# Patient Record
Sex: Male | Born: 1945
Health system: Southern US, Community
[De-identification: ages and names within clinical notes are randomized; demographics above are authoritative.]

## PROBLEM LIST (undated history)

## (undated) DIAGNOSIS — I2510 Atherosclerotic heart disease of native coronary artery without angina pectoris: Secondary | ICD-10-CM

## (undated) DIAGNOSIS — M1990 Unspecified osteoarthritis, unspecified site: Secondary | ICD-10-CM

## (undated) DIAGNOSIS — I10 Essential (primary) hypertension: Secondary | ICD-10-CM

## (undated) DIAGNOSIS — N189 Chronic kidney disease, unspecified: Secondary | ICD-10-CM

## (undated) DIAGNOSIS — E785 Hyperlipidemia, unspecified: Secondary | ICD-10-CM

## (undated) DIAGNOSIS — L237 Allergic contact dermatitis due to plants, except food: Secondary | ICD-10-CM

## (undated) DIAGNOSIS — Z86010 Personal history of colonic polyps: Secondary | ICD-10-CM

## (undated) DIAGNOSIS — Z87442 Personal history of urinary calculi: Secondary | ICD-10-CM

## (undated) DIAGNOSIS — N200 Calculus of kidney: Secondary | ICD-10-CM

## (undated) HISTORY — DX: Chronic kidney disease, unspecified: N18.9

## (undated) HISTORY — PX: CHOLECYSTECTOMY: SHX55

## (undated) HISTORY — DX: Allergic contact dermatitis due to plants, except food: L23.7

## (undated) HISTORY — DX: Personal history of colonic polyps: Z86.010

## (undated) HISTORY — DX: Hyperlipidemia, unspecified: E78.5

## (undated) HISTORY — PX: TONSILLECTOMY: SUR1361

## (undated) HISTORY — PX: COLONOSCOPY: SHX174

## (undated) HISTORY — DX: Calculus of kidney: N20.0

---

## 2002-05-02 ENCOUNTER — Encounter (INDEPENDENT_AMBULATORY_CARE_PROVIDER_SITE_OTHER): Payer: Self-pay | Admitting: Specialist

## 2002-05-02 ENCOUNTER — Ambulatory Visit (HOSPITAL_COMMUNITY): Admission: RE | Admit: 2002-05-02 | Discharge: 2002-05-02 | Payer: Self-pay | Admitting: General Surgery

## 2003-07-04 ENCOUNTER — Ambulatory Visit (HOSPITAL_COMMUNITY): Admission: RE | Admit: 2003-07-04 | Discharge: 2003-07-04 | Payer: Self-pay | Admitting: *Deleted

## 2003-07-04 ENCOUNTER — Encounter (INDEPENDENT_AMBULATORY_CARE_PROVIDER_SITE_OTHER): Payer: Self-pay | Admitting: Specialist

## 2007-01-05 ENCOUNTER — Ambulatory Visit (HOSPITAL_COMMUNITY): Admission: RE | Admit: 2007-01-05 | Discharge: 2007-01-05 | Payer: Self-pay | Admitting: *Deleted

## 2008-11-17 ENCOUNTER — Emergency Department (HOSPITAL_COMMUNITY): Admission: EM | Admit: 2008-11-17 | Discharge: 2008-11-17 | Payer: Self-pay | Admitting: Emergency Medicine

## 2010-10-05 LAB — DIFFERENTIAL
Basophils Absolute: 0 K/uL (ref 0.0–0.1)
Basophils Relative: 0 % (ref 0–1)
Eosinophils Absolute: 0.1 K/uL (ref 0.0–0.7)
Eosinophils Relative: 1 % (ref 0–5)
Lymphocytes Relative: 7 % — ABNORMAL LOW (ref 12–46)
Lymphs Abs: 0.9 K/uL (ref 0.7–4.0)
Monocytes Absolute: 0.3 K/uL (ref 0.1–1.0)
Monocytes Relative: 3 % (ref 3–12)
Neutro Abs: 10.6 K/uL — ABNORMAL HIGH (ref 1.7–7.7)
Neutrophils Relative %: 89 % — ABNORMAL HIGH (ref 43–77)

## 2010-10-05 LAB — URINALYSIS, ROUTINE W REFLEX MICROSCOPIC
Bilirubin Urine: NEGATIVE
Glucose, UA: NEGATIVE mg/dL
Ketones, ur: NEGATIVE mg/dL
Leukocytes, UA: NEGATIVE
Nitrite: NEGATIVE
Protein, ur: 30 mg/dL — AB
Specific Gravity, Urine: 1.017 (ref 1.005–1.030)
Urobilinogen, UA: 0.2 mg/dL (ref 0.0–1.0)
pH: 6.5 (ref 5.0–8.0)

## 2010-10-05 LAB — COMPREHENSIVE METABOLIC PANEL WITH GFR
ALT: 21 U/L (ref 0–53)
AST: 22 U/L (ref 0–37)
Albumin: 3.7 g/dL (ref 3.5–5.2)
Alkaline Phosphatase: 123 U/L — ABNORMAL HIGH (ref 39–117)
BUN: 14 mg/dL (ref 6–23)
CO2: 24 meq/L (ref 19–32)
Calcium: 8.8 mg/dL (ref 8.4–10.5)
Chloride: 105 meq/L (ref 96–112)
Creatinine, Ser: 1.55 mg/dL — ABNORMAL HIGH (ref 0.4–1.5)
GFR calc non Af Amer: 46 mL/min — ABNORMAL LOW
Glucose, Bld: 157 mg/dL — ABNORMAL HIGH (ref 70–99)
Potassium: 4 meq/L (ref 3.5–5.1)
Sodium: 138 meq/L (ref 135–145)
Total Bilirubin: 0.6 mg/dL (ref 0.3–1.2)
Total Protein: 6.7 g/dL (ref 6.0–8.3)

## 2010-10-05 LAB — CBC
HCT: 46.4 % (ref 39.0–52.0)
Hemoglobin: 15.7 g/dL (ref 13.0–17.0)
MCHC: 33.8 g/dL (ref 30.0–36.0)
MCV: 87 fL (ref 78.0–100.0)
Platelets: 209 K/uL (ref 150–400)
RBC: 5.33 MIL/uL (ref 4.22–5.81)
RDW: 14.7 % (ref 11.5–15.5)
WBC: 11.9 K/uL — ABNORMAL HIGH (ref 4.0–10.5)

## 2010-10-05 LAB — URINE MICROSCOPIC-ADD ON

## 2010-10-05 LAB — LIPASE, BLOOD: Lipase: 24 U/L (ref 11–59)

## 2010-11-09 NOTE — Op Note (Signed)
NAMEYESENIA, Wesley Trujillo                  ACCOUNT NO.:  [id number]   MEDICAL RECORD NO.:  [id number]          PATIENT TYPE:  AMB   LOCATION:  ENDO                         FACILITY:  [facility]   PHYSICIAN:  Georgiana Spinner, M.D.    DATE OF BIRTH:  [birth year]   DATE OF PROCEDURE:  01/05/2007  DATE OF DISCHARGE:                               OPERATIVE REPORT   PROCEDURE:  Colonoscopy.   INDICATIONS:  Colon polyps.   ANESTHESIA:  Fentanyl 75 mcg, Versed 7.5 mg.   PROCEDURE:  With the patient mildly sedated in the left lateral  decubitus position, a rectal examination was attempted.  Prostate could  not be felt.  Subsequently the Pentax videoscopic colonoscope was  inserted in the rectum; and passed under direct vision with pressure  applied to the abdomen and the patient rolled to his back.  We were able  to reach the cecum.  The cecum was identified by ileocecal valve and  appendiceal orifice both of which were photographed.   Subsequently then we explored the cecum, and slowly withdrew the  colonoscope taking circumferential views of colonic mucosa stopping then  only in the rectum which appeared normal on direct and showed  hemorrhoids on retroflex view.  The endoscope was straightened and  withdrawn.  The patient's vital signs and pulse oximeter remained  stable.  The patient tolerated the procedure well without apparent  complication.   FINDINGS:  Negative colonoscopic examination to the cecum other than  internal hemorrhoids noted.   PLAN:  Repeat examination in 5 years           ______________________________  Georgiana Spinner, M.D.     GMO/MEDQ  D:  01/05/2007  T:  01/06/2007  Job:  [id number]

## 2010-11-12 NOTE — Op Note (Signed)
NAMEARMANDO, BUKHARI                            ACCOUNT NO.:  [id number]   MEDICAL RECORD NO.:  [id number]                   PATIENT TYPE:  AMB   LOCATION:  ENDO                                 FACILITY:  [facility]   PHYSICIAN:  Georgiana Spinner, M.D.                 DATE OF BIRTH:  1945/10/14   DATE OF PROCEDURE:  07/03/2002  DATE OF DISCHARGE:                                 OPERATIVE REPORT   PROCEDURE:  Colonoscopy with polypectomy and biopsy with prolonged  procedure.   INDICATIONS:  Colon polyps.   ANESTHESIA:  1. Demerol 100 mg.  2. Versed 12 mg.   DESCRIPTION OF PROCEDURE:  With the patient mildly sedated in the left  lateral decubitus position, the Olympus videoscopic colonoscope was inserted  in the rectum, passed with pressure applied to the cecum, identified by the  ileocecal valve and crow's foot of the cecum.  From this point, the  colonoscope was slowly withdrawn, taking circumferential views of the  colonic mucosa, stopping first in the cecum about one fold removed from the  valve, at which point three polyps were seen; one was approximately 1 cm in  size; the others were between 5 mm and 1 cm in size.  All were photographed.  All were removed using snare cautery technique, setting of 20/200 blended  current, the largest of which bled somewhat.  It appeared that it may have  been transected before current was applied.  Therefore, the base was grasped  with a hot biopsy forceps and coagulated.  All three were able to be  retained for tissue.  From this point, the colonoscope was then slowly  withdrawn, taking circumferential views of the colonic mucosa, as we  withdrew all the way to the rectum, stopping next in the ascending colon  near the hepatic flexure where a fourth polyp was seen.  It too was  photographed, and it too was removed using snare cautery technique, again a  setting of 20/200 blended current with the Erbe pulse generator.  At  approximately 50 cm from  the anal verge, a very large, multi-centimeter,  multi-lobulated polyp on a long, thick stalk was seen.  It had been seen as  we had entered on approach on the colonoscopy, and this was snared  eventually with some slight difficulty getting around it but without trauma  to the tissue.  We were able to ensnare the polyp at its stalk and slowly  cut through, again, using blended current of 20/200 with the Erbe argon  photocoagulator.  There was good hemostasis, and the Roth retrieval  apparatus was used to gather in this polyp, and it was removed.  We then  reinserted the endoscope to this level and withdrew the colonoscope, taking  circumferential views of the remaining colonic mucosa, stopping only then in  the rectum which appeared normal on direct and showed hemorrhoids on  retroflexed view.  The endoscope was straightened and pulled through the  anal canal which appeared unremarkable.  The patient's vital signs and pulse  oximeter remained stable.  The patient tolerated the procedure well without  apparent complications.   FINDINGS:  Multiple polyps scattered in the colon and the cecum, ascending  colon, and at 50 cm from the anal verge, the latter of which was quite  large, multi-lobulated, removed at its stalk.   PLAN:  1. Await biopsy report.  2. The patient avoid nonsteroidal anti-inflammatory drugs for the next 2     weeks, and we will have patient call me for results of biopsy and follow     up with me as an outpatient.                                               Georgiana Spinner, M.D.    GMO/MEDQ  D:  07/04/2003  T:  07/04/2003  Job:  [id number]

## 2010-11-12 NOTE — Op Note (Signed)
   NAMECLARIS, GUYMON                            ACCOUNT NO.:  [id number]   MEDICAL RECORD NO.:  [id number]                   PATIENT TYPE:  AMB   LOCATION:  DAY                                  FACILITY:  [facility]   PHYSICIAN:  Timothy E. Earlene Plater, M.D.              DATE OF BIRTH:  May 24, 1946   DATE OF PROCEDURE:  05/02/2002  DATE OF DISCHARGE:                                 OPERATIVE REPORT   PREOPERATIVE DIAGNOSIS:  Anal polyp.   POSTOPERATIVE DIAGNOSIS:  Anal polyp.   PROCEDURE:  Excision of anal polyp.   SURGEON:  Timothy E. Earlene Plater, M.D.   ANESTHESIA:  Local standby.   INDICATIONS FOR PROCEDURE:  Mr. Hoglund is 63, has a large anal polyp and  external tag that has grown over the years and it produces more discomfort,  irritation, mucus soilage and he is now ready for excision. Colonoscopy has  been formally recommended at a time of his choice but he is due at this time  and he is aware of that.   DESCRIPTION OF PROCEDURE:  The patient was identified, permit signed,  evaluated by anesthesia, taken to the operating room, placed prone, IV  started, sedation given. He was carefully positioned and padded and then IV  sedation given. The table was broken to produce a modified jack-knife  position. The buttocks were taped apart, the anus was prepped and draped in  the usual fashion. In the right anterior position, a large external tag and  anal polyp attached there too was anesthetized with 0.25% Marcaine and it  was then withdrawn and carefully excised. These sphincters and other  mechanism left completely intact. Bleeding was not a problem and the wound  was closed with running 2-0 Chromic suture. Bleeding controlled, wound  closed nicely, anal orifice very adequate and sphincter is intact. He  tolerated it well. Gelfoam gauze, dry sterile dressing applied. He was  removed to the recovery room in good condition. Written and verbal  instructions were given including Percocet and he  will be seen and followed  as an outpatient.                                               Timothy E. Earlene Plater, M.D.    TED/MEDQ  D:  05/02/2002  T:  05/02/2002  Job:  [id number]

## 2012-07-19 ENCOUNTER — Encounter: Payer: Self-pay | Admitting: Internal Medicine

## 2012-08-16 ENCOUNTER — Ambulatory Visit (AMBULATORY_SURGERY_CENTER): Payer: Medicare Other | Admitting: *Deleted

## 2012-08-16 ENCOUNTER — Encounter: Payer: Self-pay | Admitting: Internal Medicine

## 2012-08-16 VITALS — Ht 73.0 in | Wt 287.4 lb

## 2012-08-16 DIAGNOSIS — Z1211 Encounter for screening for malignant neoplasm of colon: Secondary | ICD-10-CM

## 2012-08-16 DIAGNOSIS — Z86010 Personal history of colonic polyps: Secondary | ICD-10-CM

## 2012-08-16 MED ORDER — NA SULFATE-K SULFATE-MG SULF 17.5-3.13-1.6 GM/177ML PO SOLN: ORAL | Status: DC

## 2012-08-16 NOTE — Progress Notes (Signed)
No allergy to eggs or soy products 

## 2012-08-20 ENCOUNTER — Encounter (INDEPENDENT_AMBULATORY_CARE_PROVIDER_SITE_OTHER): Payer: Medicare Other | Admitting: Ophthalmology

## 2012-08-20 DIAGNOSIS — H349 Unspecified retinal vascular occlusion: Secondary | ICD-10-CM

## 2012-08-20 DIAGNOSIS — H35039 Hypertensive retinopathy, unspecified eye: Secondary | ICD-10-CM

## 2012-08-20 DIAGNOSIS — H43819 Vitreous degeneration, unspecified eye: Secondary | ICD-10-CM

## 2012-08-20 DIAGNOSIS — I10 Essential (primary) hypertension: Secondary | ICD-10-CM

## 2012-08-20 DIAGNOSIS — H2510 Age-related nuclear cataract, unspecified eye: Secondary | ICD-10-CM

## 2012-08-21 ENCOUNTER — Telehealth: Payer: Self-pay | Admitting: *Deleted

## 2012-08-21 DIAGNOSIS — Z1211 Encounter for screening for malignant neoplasm of colon: Secondary | ICD-10-CM

## 2012-08-21 DIAGNOSIS — Z86010 Personal history of colonic polyps: Secondary | ICD-10-CM

## 2012-08-21 MED ORDER — NA SULFATE-K SULFATE-MG SULF 17.5-3.13-1.6 GM/177ML PO SOLN: ORAL | Status: DC

## 2012-08-21 NOTE — Telephone Encounter (Signed)
Pt phoned to inform us that his suprep was not at his pharmacy.  Resent rx and phoned pt to let him know.

## 2012-08-30 ENCOUNTER — Ambulatory Visit (AMBULATORY_SURGERY_CENTER): Payer: Medicare Other | Admitting: Internal Medicine

## 2012-08-30 ENCOUNTER — Encounter: Payer: Self-pay | Admitting: Internal Medicine

## 2012-08-30 VITALS — BP 119/89 | HR 63 | Temp 96.9°F | Resp 16 | Ht 73.0 in | Wt 287.0 lb

## 2012-08-30 DIAGNOSIS — K648 Other hemorrhoids: Secondary | ICD-10-CM

## 2012-08-30 DIAGNOSIS — D126 Benign neoplasm of colon, unspecified: Secondary | ICD-10-CM

## 2012-08-30 DIAGNOSIS — Z1211 Encounter for screening for malignant neoplasm of colon: Secondary | ICD-10-CM

## 2012-08-30 DIAGNOSIS — Z860100 Personal history of colon polyps, unspecified: Secondary | ICD-10-CM

## 2012-08-30 HISTORY — DX: Personal history of colonic polyps: Z86.010

## 2012-08-30 HISTORY — DX: Personal history of colon polyps, unspecified: Z86.0100

## 2012-08-30 MED ORDER — SODIUM CHLORIDE 0.9 % IV SOLN: 500.0000 mL | INTRAVENOUS | Status: DC

## 2012-08-30 NOTE — Progress Notes (Signed)
Patient did not experience any of the following events: a burn prior to discharge; a fall within the facility; wrong site/side/patient/procedure/implant event; or a hospital transfer or hospital admission upon discharge from the facility. (G8907) Patient did not have preoperative order for IV antibiotic SSI prophylaxis. (G8918)  

## 2012-08-30 NOTE — Patient Instructions (Addendum)
Two tiny polyps will removed. They looked benign. Do not worry about these. You also have small internal hemorrhoids - not usually a significant issue. Good prep!  I will let you know pathology results and when to have another routine colonoscopy by mail.  Thank you for choosing me and [facility].  Iva Boop, MD, FACG YOU HAD AN ENDOSCOPIC PROCEDURE TODAY AT THE [facility]: Refer to the procedure report that was given to you for any specific questions about what was found during the examination.  If the procedure report does not answer your questions, please call your gastroenterologist to clarify.  If you requested that your care partner not be given the details of your procedure findings, then the procedure report has been included in a sealed envelope for you to review at your convenience later.  YOU SHOULD EXPECT: Some feelings of bloating in the abdomen. Passage of more gas than usual.  Walking can help get rid of the air that was put into your GI tract during the procedure and reduce the bloating. If you had a lower endoscopy (such as a colonoscopy or flexible sigmoidoscopy) you may notice spotting of blood in your stool or on the toilet paper. If you underwent a bowel prep for your procedure, then you may not have a normal bowel movement for a few days.  DIET: Your first meal following the procedure should be a light meal and then it is ok to progress to your normal diet.  A half-sandwich or bowl of soup is an example of a good first meal.  Heavy or fried foods are harder to digest and may make you feel nauseous or bloated.  Likewise meals heavy in dairy and vegetables can cause extra gas to form and this can also increase the bloating.  Drink plenty of fluids but you should avoid alcoholic beverages for 24 hours.  ACTIVITY: Your care partner should take you home directly after the procedure.  You should plan to take it easy, moving slowly for the rest of the  day.  You can resume normal activity the day after the procedure however you should NOT DRIVE or use heavy machinery for 24 hours (because of the sedation medicines used during the test).    SYMPTOMS TO REPORT IMMEDIATELY: A gastroenterologist can be reached at any hour.  During normal business hours, 8:30 AM to 5:00 PM Monday through Friday, call [phone].  After hours and on weekends, please call the GI answering service at [phone] who will take a message and have the physician on call contact you.   Following lower endoscopy (colonoscopy or flexible sigmoidoscopy):  Excessive amounts of blood in the stool  Significant tenderness or worsening of abdominal pains  Swelling of the abdomen that is new, acute  Fever of 100F or higher  FOLLOW UP: If any biopsies were taken you will be contacted by phone or by letter within the next 1-3 weeks.  Call your gastroenterologist if you have not heard about the biopsies in 3 weeks.  Our staff will call the home number listed on your records the next business day following your procedure to check on you and address any questions or concerns that you may have at that time regarding the information given to you following your procedure. This is a courtesy call and so if there is no answer at the home number and we have not heard from you through the emergency physician on call, we will assume that you  have returned to your regular daily activities without incident.  SIGNATURES/CONFIDENTIALITY: You and/or your care partner have signed paperwork which will be entered into your electronic medical record.  These signatures attest to the fact that that the information above on your After Visit Summary has been reviewed and is understood.  Full responsibility of the confidentiality of this discharge information lies with you and/or your care-partner.  Polyps-handout given  Hemorrhoids-handout given  Repeat colonoscopy will be determined by  pathology

## 2012-08-30 NOTE — Op Note (Signed)
[facility] [address] [place], [address]   COLONOSCOPY PROCEDURE REPORT  PATIENT: Wesley Trujillo, Wesley Trujillo  MR#: [id number] BIRTHDATE: 07/10/1945 , 66  yrs. old GENDER: Male ENDOSCOPIST: Iva Boop, MD, [facility] PROCEDURE DATE:  08/30/2012 PROCEDURE:   Colonoscopy with biopsy ASA CLASS:   Class II INDICATIONS:Screening and surveillance,personal history of colonic polyps. MEDICATIONS: Propofol (Diprivan) 260 mg IV, MAC sedation, administered by CRNA, and These medications were titrated to patient response per physician's verbal order  DESCRIPTION OF PROCEDURE:   After the risks benefits and alternatives of the procedure were thoroughly explained, informed consent was obtained.  A digital rectal exam revealed no abnormalities of the rectum, A digital rectal exam revealed no prostatic nodules, and A digital rectal exam revealed the prostate was not enlarged.   The LB CF-H180AL [id number]  endoscope was introduced through the anus and advanced to the cecum, which was identified by both the appendix and ileocecal valve. No adverse events experienced.   The quality of the prep was Suprep good  The instrument was then slowly withdrawn as the colon was fully examined.      COLON FINDINGS: Two polypoid shaped sessile polyps measuring 2-3 mm in size were found at the cecum and in the transverse colon.  A polypectomy was performed with cold forceps.  The resection was complete and the polyp tissue was completely retrieved.   Small internal hemorrhoids were found.   The colon mucosa was otherwise normal.  Retroflexed views revealed internal hemorrhoids. The time to cecum=5 minutes 0 seconds.  Withdrawal time=15 minutes 10 seconds.  The scope was withdrawn and the procedure completed. COMPLICATIONS: There were no complications.  ENDOSCOPIC IMPRESSION: 1.   Two sessile polyps measuring 2-3 mm in size were found at the cecum and in the transverse colon; polypectomy was  performed with cold forceps 2.   Small internal hemorrhoids 3.   The colon mucosa was otherwise normal - good prep.  RECOMMENDATIONS: Timing of repeat colonoscopy will be determined by pathology findings in this patient with 5 adenomas/TV adenoma 2005, none 2008.   eSigned:  Iva Boop, MD, [facility] 08/30/2012 10:17 AM cc: Romero Liner, MD and The Patient

## 2012-08-30 NOTE — Progress Notes (Signed)
Called to room to assist during endoscopic procedure.  Patient ID and intended procedure confirmed with present staff. Received instructions for my participation in the procedure from the performing physician.  

## 2012-08-31 ENCOUNTER — Telehealth: Payer: Self-pay

## 2012-08-31 NOTE — Telephone Encounter (Signed)
  Follow up Call-  Call back number 08/30/2012  Post procedure Call Back phone  # [phone]  Permission to leave phone message Yes     Patient questions:  Do you have a fever, pain , or abdominal swelling? no Pain Score  0 *  Have you tolerated food without any problems? yes  Have you been able to return to your normal activities? yes  Do you have any questions about your discharge instructions: Diet   no Medications  no Follow up visit  no  Do you have questions or concerns about your Care? no  Actions: * If pain score is 4 or above: No action needed, pain <4.

## 2012-09-05 ENCOUNTER — Encounter: Payer: Self-pay | Admitting: Internal Medicine

## 2012-09-05 NOTE — Progress Notes (Signed)
Quick Note:  2 diminutive adenomas Repeat colonoscopy 08/2017 ______

## 2012-12-24 ENCOUNTER — Ambulatory Visit (INDEPENDENT_AMBULATORY_CARE_PROVIDER_SITE_OTHER): Payer: Medicare Other | Admitting: Ophthalmology

## 2012-12-26 ENCOUNTER — Ambulatory Visit (INDEPENDENT_AMBULATORY_CARE_PROVIDER_SITE_OTHER): Payer: Medicare Other | Admitting: Ophthalmology

## 2012-12-26 DIAGNOSIS — I10 Essential (primary) hypertension: Secondary | ICD-10-CM

## 2012-12-26 DIAGNOSIS — H35039 Hypertensive retinopathy, unspecified eye: Secondary | ICD-10-CM

## 2012-12-26 DIAGNOSIS — H348392 Tributary (branch) retinal vein occlusion, unspecified eye, stable: Secondary | ICD-10-CM

## 2012-12-26 DIAGNOSIS — H2510 Age-related nuclear cataract, unspecified eye: Secondary | ICD-10-CM

## 2013-07-01 ENCOUNTER — Ambulatory Visit (INDEPENDENT_AMBULATORY_CARE_PROVIDER_SITE_OTHER): Payer: Medicare Other | Admitting: Ophthalmology

## 2013-07-01 DIAGNOSIS — H43819 Vitreous degeneration, unspecified eye: Secondary | ICD-10-CM

## 2013-07-01 DIAGNOSIS — H35039 Hypertensive retinopathy, unspecified eye: Secondary | ICD-10-CM

## 2013-07-01 DIAGNOSIS — I10 Essential (primary) hypertension: Secondary | ICD-10-CM

## 2013-07-01 DIAGNOSIS — H348392 Tributary (branch) retinal vein occlusion, unspecified eye, stable: Secondary | ICD-10-CM

## 2013-07-01 DIAGNOSIS — H2510 Age-related nuclear cataract, unspecified eye: Secondary | ICD-10-CM

## 2014-07-01 ENCOUNTER — Ambulatory Visit (INDEPENDENT_AMBULATORY_CARE_PROVIDER_SITE_OTHER): Payer: Medicare Other | Admitting: Ophthalmology

## 2014-10-08 ENCOUNTER — Encounter (INDEPENDENT_AMBULATORY_CARE_PROVIDER_SITE_OTHER): Payer: Medicare Other | Admitting: Ophthalmology

## 2014-10-08 DIAGNOSIS — H34232 Retinal artery branch occlusion, left eye: Secondary | ICD-10-CM

## 2014-10-08 DIAGNOSIS — H35033 Hypertensive retinopathy, bilateral: Secondary | ICD-10-CM | POA: Diagnosis not present

## 2014-10-08 DIAGNOSIS — H43813 Vitreous degeneration, bilateral: Secondary | ICD-10-CM

## 2014-10-08 DIAGNOSIS — H34832 Tributary (branch) retinal vein occlusion, left eye: Secondary | ICD-10-CM | POA: Diagnosis not present

## 2014-10-08 DIAGNOSIS — I10 Essential (primary) hypertension: Secondary | ICD-10-CM

## 2014-10-13 ENCOUNTER — Other Ambulatory Visit: Payer: Self-pay | Admitting: Internal Medicine

## 2014-10-13 DIAGNOSIS — I749 Embolism and thrombosis of unspecified artery: Secondary | ICD-10-CM

## 2014-10-17 ENCOUNTER — Ambulatory Visit
Admission: RE | Admit: 2014-10-17 | Discharge: 2014-10-17 | Disposition: A | Discharge status: Home / Self Care | Payer: Medicare Other | Source: Ambulatory Visit | Attending: Internal Medicine | Admitting: Internal Medicine

## 2014-10-17 DIAGNOSIS — I749 Embolism and thrombosis of unspecified artery: Secondary | ICD-10-CM

## 2014-11-04 ENCOUNTER — Encounter (INDEPENDENT_AMBULATORY_CARE_PROVIDER_SITE_OTHER): Payer: Medicare Other | Admitting: Ophthalmology

## 2014-11-04 DIAGNOSIS — H43813 Vitreous degeneration, bilateral: Secondary | ICD-10-CM

## 2014-11-04 DIAGNOSIS — H4312 Vitreous hemorrhage, left eye: Secondary | ICD-10-CM

## 2014-11-04 DIAGNOSIS — H34832 Tributary (branch) retinal vein occlusion, left eye: Secondary | ICD-10-CM | POA: Diagnosis not present

## 2014-11-04 DIAGNOSIS — H35033 Hypertensive retinopathy, bilateral: Secondary | ICD-10-CM | POA: Diagnosis not present

## 2014-11-04 DIAGNOSIS — I10 Essential (primary) hypertension: Secondary | ICD-10-CM | POA: Diagnosis not present

## 2014-11-04 DIAGNOSIS — H2513 Age-related nuclear cataract, bilateral: Secondary | ICD-10-CM | POA: Diagnosis not present

## 2014-11-04 DIAGNOSIS — H34232 Retinal artery branch occlusion, left eye: Secondary | ICD-10-CM | POA: Diagnosis not present

## 2014-12-02 ENCOUNTER — Encounter (INDEPENDENT_AMBULATORY_CARE_PROVIDER_SITE_OTHER): Payer: Medicare Other | Admitting: Ophthalmology

## 2014-12-02 DIAGNOSIS — H34232 Retinal artery branch occlusion, left eye: Secondary | ICD-10-CM | POA: Diagnosis not present

## 2014-12-02 DIAGNOSIS — H35033 Hypertensive retinopathy, bilateral: Secondary | ICD-10-CM | POA: Diagnosis not present

## 2014-12-02 DIAGNOSIS — H34832 Tributary (branch) retinal vein occlusion, left eye: Secondary | ICD-10-CM | POA: Diagnosis not present

## 2014-12-02 DIAGNOSIS — H43813 Vitreous degeneration, bilateral: Secondary | ICD-10-CM | POA: Diagnosis not present

## 2014-12-02 DIAGNOSIS — I10 Essential (primary) hypertension: Secondary | ICD-10-CM

## 2014-12-02 DIAGNOSIS — H2513 Age-related nuclear cataract, bilateral: Secondary | ICD-10-CM

## 2015-01-06 ENCOUNTER — Encounter (INDEPENDENT_AMBULATORY_CARE_PROVIDER_SITE_OTHER): Payer: Medicare Other | Admitting: Ophthalmology

## 2015-01-06 DIAGNOSIS — H35033 Hypertensive retinopathy, bilateral: Secondary | ICD-10-CM

## 2015-01-06 DIAGNOSIS — H43813 Vitreous degeneration, bilateral: Secondary | ICD-10-CM | POA: Diagnosis not present

## 2015-01-06 DIAGNOSIS — H34232 Retinal artery branch occlusion, left eye: Secondary | ICD-10-CM

## 2015-01-06 DIAGNOSIS — I10 Essential (primary) hypertension: Secondary | ICD-10-CM

## 2015-01-06 DIAGNOSIS — H34832 Tributary (branch) retinal vein occlusion, left eye: Secondary | ICD-10-CM

## 2015-02-17 ENCOUNTER — Encounter (INDEPENDENT_AMBULATORY_CARE_PROVIDER_SITE_OTHER): Payer: Medicare Other | Admitting: Ophthalmology

## 2015-02-17 DIAGNOSIS — H34831 Tributary (branch) retinal vein occlusion, right eye: Secondary | ICD-10-CM

## 2015-02-17 DIAGNOSIS — H35033 Hypertensive retinopathy, bilateral: Secondary | ICD-10-CM | POA: Diagnosis not present

## 2015-02-17 DIAGNOSIS — H34231 Retinal artery branch occlusion, right eye: Secondary | ICD-10-CM

## 2015-02-17 DIAGNOSIS — I10 Essential (primary) hypertension: Secondary | ICD-10-CM | POA: Diagnosis not present

## 2015-02-17 DIAGNOSIS — H43813 Vitreous degeneration, bilateral: Secondary | ICD-10-CM

## 2015-04-07 ENCOUNTER — Encounter (INDEPENDENT_AMBULATORY_CARE_PROVIDER_SITE_OTHER): Payer: Medicare Other | Admitting: Ophthalmology

## 2015-04-07 DIAGNOSIS — H43813 Vitreous degeneration, bilateral: Secondary | ICD-10-CM

## 2015-04-07 DIAGNOSIS — H35033 Hypertensive retinopathy, bilateral: Secondary | ICD-10-CM | POA: Diagnosis not present

## 2015-04-07 DIAGNOSIS — I10 Essential (primary) hypertension: Secondary | ICD-10-CM | POA: Diagnosis not present

## 2015-04-07 DIAGNOSIS — H34832 Tributary (branch) retinal vein occlusion, left eye, with macular edema: Secondary | ICD-10-CM

## 2015-05-26 ENCOUNTER — Encounter (INDEPENDENT_AMBULATORY_CARE_PROVIDER_SITE_OTHER): Payer: Medicare Other | Admitting: Ophthalmology

## 2015-05-26 DIAGNOSIS — H35372 Puckering of macula, left eye: Secondary | ICD-10-CM

## 2015-05-26 DIAGNOSIS — H34232 Retinal artery branch occlusion, left eye: Secondary | ICD-10-CM

## 2015-05-26 DIAGNOSIS — H43813 Vitreous degeneration, bilateral: Secondary | ICD-10-CM | POA: Diagnosis not present

## 2015-05-26 DIAGNOSIS — H34832 Tributary (branch) retinal vein occlusion, left eye, with macular edema: Secondary | ICD-10-CM

## 2015-05-26 DIAGNOSIS — I10 Essential (primary) hypertension: Secondary | ICD-10-CM

## 2015-05-26 DIAGNOSIS — H35033 Hypertensive retinopathy, bilateral: Secondary | ICD-10-CM

## 2015-07-28 ENCOUNTER — Encounter (INDEPENDENT_AMBULATORY_CARE_PROVIDER_SITE_OTHER): Payer: Medicare Other | Admitting: Ophthalmology

## 2015-08-03 DIAGNOSIS — Z125 Encounter for screening for malignant neoplasm of prostate: Secondary | ICD-10-CM | POA: Diagnosis not present

## 2015-08-03 DIAGNOSIS — Z0000 Encounter for general adult medical examination without abnormal findings: Secondary | ICD-10-CM | POA: Diagnosis not present

## 2015-08-03 DIAGNOSIS — R739 Hyperglycemia, unspecified: Secondary | ICD-10-CM | POA: Diagnosis not present

## 2015-08-03 DIAGNOSIS — I10 Essential (primary) hypertension: Secondary | ICD-10-CM | POA: Diagnosis not present

## 2015-08-04 ENCOUNTER — Encounter (INDEPENDENT_AMBULATORY_CARE_PROVIDER_SITE_OTHER): Payer: Medicare Other | Admitting: Ophthalmology

## 2015-08-04 DIAGNOSIS — I10 Essential (primary) hypertension: Secondary | ICD-10-CM

## 2015-08-04 DIAGNOSIS — H34232 Retinal artery branch occlusion, left eye: Secondary | ICD-10-CM | POA: Diagnosis not present

## 2015-08-04 DIAGNOSIS — H43813 Vitreous degeneration, bilateral: Secondary | ICD-10-CM

## 2015-08-04 DIAGNOSIS — H35033 Hypertensive retinopathy, bilateral: Secondary | ICD-10-CM | POA: Diagnosis not present

## 2015-08-04 DIAGNOSIS — H2513 Age-related nuclear cataract, bilateral: Secondary | ICD-10-CM | POA: Diagnosis not present

## 2015-08-04 DIAGNOSIS — H34832 Tributary (branch) retinal vein occlusion, left eye, with macular edema: Secondary | ICD-10-CM | POA: Diagnosis not present

## 2015-08-10 DIAGNOSIS — R748 Abnormal levels of other serum enzymes: Secondary | ICD-10-CM | POA: Diagnosis not present

## 2015-08-10 DIAGNOSIS — Z7982 Long term (current) use of aspirin: Secondary | ICD-10-CM | POA: Diagnosis not present

## 2015-08-10 DIAGNOSIS — Z882 Allergy status to sulfonamides status: Secondary | ICD-10-CM | POA: Diagnosis not present

## 2015-08-10 DIAGNOSIS — M2040 Other hammer toe(s) (acquired), unspecified foot: Secondary | ICD-10-CM | POA: Diagnosis not present

## 2015-08-10 DIAGNOSIS — Z0000 Encounter for general adult medical examination without abnormal findings: Secondary | ICD-10-CM | POA: Diagnosis not present

## 2015-08-10 DIAGNOSIS — N281 Cyst of kidney, acquired: Secondary | ICD-10-CM | POA: Diagnosis not present

## 2015-08-10 DIAGNOSIS — K649 Unspecified hemorrhoids: Secondary | ICD-10-CM | POA: Diagnosis not present

## 2015-08-10 DIAGNOSIS — I10 Essential (primary) hypertension: Secondary | ICD-10-CM | POA: Diagnosis not present

## 2015-08-10 DIAGNOSIS — I6523 Occlusion and stenosis of bilateral carotid arteries: Secondary | ICD-10-CM | POA: Diagnosis not present

## 2015-08-10 DIAGNOSIS — N200 Calculus of kidney: Secondary | ICD-10-CM | POA: Diagnosis not present

## 2015-10-20 ENCOUNTER — Encounter (INDEPENDENT_AMBULATORY_CARE_PROVIDER_SITE_OTHER): Payer: Medicare Other | Admitting: Ophthalmology

## 2015-10-20 DIAGNOSIS — H35033 Hypertensive retinopathy, bilateral: Secondary | ICD-10-CM

## 2015-10-20 DIAGNOSIS — H43813 Vitreous degeneration, bilateral: Secondary | ICD-10-CM

## 2015-10-20 DIAGNOSIS — H34832 Tributary (branch) retinal vein occlusion, left eye, with macular edema: Secondary | ICD-10-CM

## 2015-10-20 DIAGNOSIS — I10 Essential (primary) hypertension: Secondary | ICD-10-CM | POA: Diagnosis not present

## 2015-10-20 DIAGNOSIS — H34232 Retinal artery branch occlusion, left eye: Secondary | ICD-10-CM

## 2015-10-20 DIAGNOSIS — H2513 Age-related nuclear cataract, bilateral: Secondary | ICD-10-CM

## 2015-10-27 DIAGNOSIS — H524 Presbyopia: Secondary | ICD-10-CM | POA: Diagnosis not present

## 2016-01-07 ENCOUNTER — Encounter (INDEPENDENT_AMBULATORY_CARE_PROVIDER_SITE_OTHER): Payer: Medicare Other | Admitting: Ophthalmology

## 2016-01-07 DIAGNOSIS — I10 Essential (primary) hypertension: Secondary | ICD-10-CM | POA: Diagnosis not present

## 2016-01-07 DIAGNOSIS — H34832 Tributary (branch) retinal vein occlusion, left eye, with macular edema: Secondary | ICD-10-CM | POA: Diagnosis not present

## 2016-01-07 DIAGNOSIS — H2513 Age-related nuclear cataract, bilateral: Secondary | ICD-10-CM | POA: Diagnosis not present

## 2016-01-07 DIAGNOSIS — H43813 Vitreous degeneration, bilateral: Secondary | ICD-10-CM

## 2016-01-07 DIAGNOSIS — H35033 Hypertensive retinopathy, bilateral: Secondary | ICD-10-CM

## 2016-01-07 DIAGNOSIS — H34232 Retinal artery branch occlusion, left eye: Secondary | ICD-10-CM | POA: Diagnosis not present

## 2016-04-07 ENCOUNTER — Encounter (INDEPENDENT_AMBULATORY_CARE_PROVIDER_SITE_OTHER): Payer: Medicare Other | Admitting: Ophthalmology

## 2016-04-07 DIAGNOSIS — H35033 Hypertensive retinopathy, bilateral: Secondary | ICD-10-CM | POA: Diagnosis not present

## 2016-04-07 DIAGNOSIS — I10 Essential (primary) hypertension: Secondary | ICD-10-CM

## 2016-04-07 DIAGNOSIS — H43813 Vitreous degeneration, bilateral: Secondary | ICD-10-CM

## 2016-04-07 DIAGNOSIS — H34232 Retinal artery branch occlusion, left eye: Secondary | ICD-10-CM

## 2016-04-07 DIAGNOSIS — H34832 Tributary (branch) retinal vein occlusion, left eye, with macular edema: Secondary | ICD-10-CM | POA: Diagnosis not present

## 2016-07-19 ENCOUNTER — Encounter (INDEPENDENT_AMBULATORY_CARE_PROVIDER_SITE_OTHER): Payer: Medicare Other | Admitting: Ophthalmology

## 2016-07-19 DIAGNOSIS — H35033 Hypertensive retinopathy, bilateral: Secondary | ICD-10-CM

## 2016-07-19 DIAGNOSIS — H34232 Retinal artery branch occlusion, left eye: Secondary | ICD-10-CM | POA: Diagnosis not present

## 2016-07-19 DIAGNOSIS — I10 Essential (primary) hypertension: Secondary | ICD-10-CM

## 2016-07-19 DIAGNOSIS — H43813 Vitreous degeneration, bilateral: Secondary | ICD-10-CM

## 2016-07-19 DIAGNOSIS — H35372 Puckering of macula, left eye: Secondary | ICD-10-CM

## 2016-07-19 DIAGNOSIS — H34832 Tributary (branch) retinal vein occlusion, left eye, with macular edema: Secondary | ICD-10-CM | POA: Diagnosis not present

## 2016-07-21 ENCOUNTER — Encounter (INDEPENDENT_AMBULATORY_CARE_PROVIDER_SITE_OTHER): Payer: Medicare Other | Admitting: Ophthalmology

## 2016-08-12 DIAGNOSIS — I10 Essential (primary) hypertension: Secondary | ICD-10-CM | POA: Diagnosis not present

## 2016-08-12 DIAGNOSIS — Z0000 Encounter for general adult medical examination without abnormal findings: Secondary | ICD-10-CM | POA: Diagnosis not present

## 2016-08-12 DIAGNOSIS — R945 Abnormal results of liver function studies: Secondary | ICD-10-CM | POA: Diagnosis not present

## 2016-08-12 DIAGNOSIS — Z125 Encounter for screening for malignant neoplasm of prostate: Secondary | ICD-10-CM | POA: Diagnosis not present

## 2016-08-12 DIAGNOSIS — Z7982 Long term (current) use of aspirin: Secondary | ICD-10-CM | POA: Diagnosis not present

## 2016-08-12 DIAGNOSIS — R749 Abnormal serum enzyme level, unspecified: Secondary | ICD-10-CM | POA: Diagnosis not present

## 2016-08-17 DIAGNOSIS — Z0001 Encounter for general adult medical examination with abnormal findings: Secondary | ICD-10-CM | POA: Diagnosis not present

## 2016-08-17 DIAGNOSIS — Z1212 Encounter for screening for malignant neoplasm of rectum: Secondary | ICD-10-CM | POA: Diagnosis not present

## 2016-08-17 DIAGNOSIS — Z86010 Personal history of colonic polyps: Secondary | ICD-10-CM | POA: Diagnosis not present

## 2016-08-17 DIAGNOSIS — I10 Essential (primary) hypertension: Secondary | ICD-10-CM | POA: Diagnosis not present

## 2016-08-17 DIAGNOSIS — N200 Calculus of kidney: Secondary | ICD-10-CM | POA: Diagnosis not present

## 2016-11-01 ENCOUNTER — Encounter (INDEPENDENT_AMBULATORY_CARE_PROVIDER_SITE_OTHER): Payer: Medicare Other | Admitting: Ophthalmology

## 2016-11-01 DIAGNOSIS — H35033 Hypertensive retinopathy, bilateral: Secondary | ICD-10-CM | POA: Diagnosis not present

## 2016-11-01 DIAGNOSIS — H34832 Tributary (branch) retinal vein occlusion, left eye, with macular edema: Secondary | ICD-10-CM | POA: Diagnosis not present

## 2016-11-01 DIAGNOSIS — H35372 Puckering of macula, left eye: Secondary | ICD-10-CM | POA: Diagnosis not present

## 2016-11-01 DIAGNOSIS — I10 Essential (primary) hypertension: Secondary | ICD-10-CM | POA: Diagnosis not present

## 2016-11-01 DIAGNOSIS — H43813 Vitreous degeneration, bilateral: Secondary | ICD-10-CM | POA: Diagnosis not present

## 2016-11-01 DIAGNOSIS — H2513 Age-related nuclear cataract, bilateral: Secondary | ICD-10-CM | POA: Diagnosis not present

## 2016-11-01 DIAGNOSIS — H34232 Retinal artery branch occlusion, left eye: Secondary | ICD-10-CM | POA: Diagnosis not present

## 2016-11-08 DIAGNOSIS — H524 Presbyopia: Secondary | ICD-10-CM | POA: Diagnosis not present

## 2016-12-16 DIAGNOSIS — R1313 Dysphagia, pharyngeal phase: Secondary | ICD-10-CM | POA: Diagnosis not present

## 2017-02-14 ENCOUNTER — Encounter (INDEPENDENT_AMBULATORY_CARE_PROVIDER_SITE_OTHER): Payer: Medicare Other | Admitting: Ophthalmology

## 2017-02-14 DIAGNOSIS — H34232 Retinal artery branch occlusion, left eye: Secondary | ICD-10-CM

## 2017-02-14 DIAGNOSIS — H35372 Puckering of macula, left eye: Secondary | ICD-10-CM

## 2017-02-14 DIAGNOSIS — H43813 Vitreous degeneration, bilateral: Secondary | ICD-10-CM | POA: Diagnosis not present

## 2017-02-14 DIAGNOSIS — H2513 Age-related nuclear cataract, bilateral: Secondary | ICD-10-CM

## 2017-02-14 DIAGNOSIS — H35033 Hypertensive retinopathy, bilateral: Secondary | ICD-10-CM | POA: Diagnosis not present

## 2017-02-14 DIAGNOSIS — H34832 Tributary (branch) retinal vein occlusion, left eye, with macular edema: Secondary | ICD-10-CM | POA: Diagnosis not present

## 2017-05-30 ENCOUNTER — Encounter (INDEPENDENT_AMBULATORY_CARE_PROVIDER_SITE_OTHER): Payer: Medicare Other | Admitting: Ophthalmology

## 2017-05-30 DIAGNOSIS — H2513 Age-related nuclear cataract, bilateral: Secondary | ICD-10-CM

## 2017-05-30 DIAGNOSIS — H35372 Puckering of macula, left eye: Secondary | ICD-10-CM

## 2017-05-30 DIAGNOSIS — H34832 Tributary (branch) retinal vein occlusion, left eye, with macular edema: Secondary | ICD-10-CM | POA: Diagnosis not present

## 2017-05-30 DIAGNOSIS — H43813 Vitreous degeneration, bilateral: Secondary | ICD-10-CM

## 2017-05-30 DIAGNOSIS — I10 Essential (primary) hypertension: Secondary | ICD-10-CM

## 2017-05-30 DIAGNOSIS — H34232 Retinal artery branch occlusion, left eye: Secondary | ICD-10-CM | POA: Diagnosis not present

## 2017-05-30 DIAGNOSIS — H35033 Hypertensive retinopathy, bilateral: Secondary | ICD-10-CM | POA: Diagnosis not present

## 2017-08-21 DIAGNOSIS — Z125 Encounter for screening for malignant neoplasm of prostate: Secondary | ICD-10-CM | POA: Diagnosis not present

## 2017-08-21 DIAGNOSIS — I10 Essential (primary) hypertension: Secondary | ICD-10-CM | POA: Diagnosis not present

## 2017-08-25 DIAGNOSIS — I10 Essential (primary) hypertension: Secondary | ICD-10-CM | POA: Diagnosis not present

## 2017-08-25 DIAGNOSIS — Z86010 Personal history of colonic polyps: Secondary | ICD-10-CM | POA: Diagnosis not present

## 2017-08-25 DIAGNOSIS — Z0001 Encounter for general adult medical examination with abnormal findings: Secondary | ICD-10-CM | POA: Diagnosis not present

## 2017-08-25 DIAGNOSIS — R748 Abnormal levels of other serum enzymes: Secondary | ICD-10-CM | POA: Diagnosis not present

## 2017-08-31 DIAGNOSIS — M25562 Pain in left knee: Secondary | ICD-10-CM | POA: Diagnosis not present

## 2017-09-07 ENCOUNTER — Encounter: Payer: Self-pay | Admitting: Internal Medicine

## 2017-09-12 ENCOUNTER — Encounter (INDEPENDENT_AMBULATORY_CARE_PROVIDER_SITE_OTHER): Payer: Medicare Other | Admitting: Ophthalmology

## 2017-09-18 ENCOUNTER — Encounter: Payer: Self-pay | Admitting: Internal Medicine

## 2017-09-19 ENCOUNTER — Encounter (INDEPENDENT_AMBULATORY_CARE_PROVIDER_SITE_OTHER): Payer: Medicare Other | Admitting: Ophthalmology

## 2017-09-19 DIAGNOSIS — H43813 Vitreous degeneration, bilateral: Secondary | ICD-10-CM

## 2017-09-19 DIAGNOSIS — I10 Essential (primary) hypertension: Secondary | ICD-10-CM

## 2017-09-19 DIAGNOSIS — H2513 Age-related nuclear cataract, bilateral: Secondary | ICD-10-CM | POA: Diagnosis not present

## 2017-09-19 DIAGNOSIS — H35033 Hypertensive retinopathy, bilateral: Secondary | ICD-10-CM

## 2017-09-19 DIAGNOSIS — H34832 Tributary (branch) retinal vein occlusion, left eye, with macular edema: Secondary | ICD-10-CM

## 2017-09-19 DIAGNOSIS — H34232 Retinal artery branch occlusion, left eye: Secondary | ICD-10-CM | POA: Diagnosis not present

## 2017-09-19 DIAGNOSIS — H35372 Puckering of macula, left eye: Secondary | ICD-10-CM | POA: Diagnosis not present

## 2017-11-23 ENCOUNTER — Ambulatory Visit (AMBULATORY_SURGERY_CENTER): Payer: Self-pay

## 2017-11-23 ENCOUNTER — Other Ambulatory Visit: Payer: Self-pay

## 2017-11-23 VITALS — Ht 74.0 in | Wt 280.0 lb

## 2017-11-23 DIAGNOSIS — Z86010 Personal history of colonic polyps: Secondary | ICD-10-CM

## 2017-11-23 NOTE — Progress Notes (Signed)
No egg or soy allergy known to patient  No issues with past sedation with any surgeries  or procedures, no intubation problems  No diet pills per patient No home 02 use per patient  No blood thinners per patient  Pt denies issues with constipation  No A fib or A flutter  EMMI video sent to pt's e mail , pt declined    

## 2017-11-27 ENCOUNTER — Encounter: Payer: Self-pay | Admitting: Internal Medicine

## 2017-12-02 DIAGNOSIS — L237 Allergic contact dermatitis due to plants, except food: Secondary | ICD-10-CM | POA: Diagnosis not present

## 2017-12-07 ENCOUNTER — Encounter: Payer: Self-pay | Admitting: Internal Medicine

## 2017-12-07 ENCOUNTER — Ambulatory Visit (AMBULATORY_SURGERY_CENTER): Payer: Medicare Other | Admitting: Internal Medicine

## 2017-12-07 ENCOUNTER — Other Ambulatory Visit: Payer: Self-pay

## 2017-12-07 VITALS — BP 156/88 | HR 59 | Temp 97.8°F | Resp 17 | Ht 74.0 in | Wt 280.0 lb

## 2017-12-07 DIAGNOSIS — D123 Benign neoplasm of transverse colon: Secondary | ICD-10-CM | POA: Diagnosis not present

## 2017-12-07 DIAGNOSIS — D122 Benign neoplasm of ascending colon: Secondary | ICD-10-CM

## 2017-12-07 DIAGNOSIS — Z86010 Personal history of colonic polyps: Secondary | ICD-10-CM

## 2017-12-07 DIAGNOSIS — D120 Benign neoplasm of cecum: Secondary | ICD-10-CM

## 2017-12-07 DIAGNOSIS — D124 Benign neoplasm of descending colon: Secondary | ICD-10-CM | POA: Diagnosis not present

## 2017-12-07 DIAGNOSIS — N189 Chronic kidney disease, unspecified: Secondary | ICD-10-CM | POA: Diagnosis not present

## 2017-12-07 HISTORY — PX: COLONOSCOPY: SHX174

## 2017-12-07 MED ORDER — SODIUM CHLORIDE 0.9 % IV SOLN: 500.0000 mL | Freq: Once | INTRAVENOUS | Status: DC

## 2017-12-07 NOTE — Progress Notes (Signed)
Pt's states no medical or surgical changes since previsit or office visit. Patient with poison ivy outbreak on left arm distal to ac. Treated since 12/02/17

## 2017-12-07 NOTE — Progress Notes (Signed)
Called to room to assist during endoscopic procedure.  Patient ID and intended procedure confirmed with present staff. Received instructions for my participation in the procedure from the performing physician.  

## 2017-12-07 NOTE — Op Note (Signed)
[facility] Patient Name: Wesley Trujillo Procedure Date: 12/07/2017 10:03 AM MRN: [id number] Endoscopist: Gatha Mayer , MD Age: 72 Referring MD:  Date of Birth: [birth year] Gender: Male Account #: [id number] Procedure:                Colonoscopy Indications:              Surveillance: Personal history of adenomatous                            polyps on last colonoscopy 5 years ago Medicines:                Propofol per Anesthesia, Monitored Anesthesia Care Procedure:                Pre-Anesthesia Assessment:                           - Prior to the procedure, a History and Physical                            was performed, and patient medications and                            allergies were reviewed. The patient's tolerance of                            previous anesthesia was also reviewed. The risks                            and benefits of the procedure and the sedation                            options and risks were discussed with the patient.                            All questions were answered, and informed consent                            was obtained. Prior Anticoagulants: The patient has                            taken no previous anticoagulant or antiplatelet                            agents. ASA Grade Assessment: II - A patient with                            mild systemic disease. After reviewing the risks                            and benefits, the patient was deemed in                            satisfactory condition to undergo the procedure.  After obtaining informed consent, the colonoscope                            was passed under direct vision. Throughout the                            procedure, the patient's blood pressure, pulse, and                            oxygen saturations were monitored continuously. The                            Colonoscope was introduced through the anus and                             advanced to the the cecum, identified by                            appendiceal orifice and ileocecal valve. The                            colonoscopy was somewhat difficult due to                            significant looping. Successful completion of the                            procedure was aided by applying abdominal pressure.                            The patient tolerated the procedure well. The                            quality of the bowel preparation was good. The                            bowel preparation used was Miralax. The ileocecal                            valve, appendiceal orifice, and rectum were                            photographed. Scope In: 10:11:49 AM Scope Out: 10:34:56 AM Scope Withdrawal Time: 0 hours 20 minutes 40 seconds  Total Procedure Duration: 0 hours 23 minutes 7 seconds  Findings:                 The perianal and digital rectal examinations were                            normal. Pertinent negatives include normal prostate                            (size, shape, and consistency).  Seven sessile polyps were found in the descending                            colon, transverse colon and ascending colon. The                            polyps were 4 to 8 mm in size. These polyps were                            removed with a cold snare. Resection and retrieval                            were complete. Verification of patient                            identification for the specimen was done. Estimated                            blood loss was minimal.                           A 1 to 2 mm polyp was found in the cecum. The polyp                            was sessile. The polyp was removed with a cold                            biopsy forceps. Resection and retrieval were                            complete. Verification of patient identification                            for the specimen was done. Estimated blood loss  was                            minimal.                           Internal hemorrhoids were found during retroflexion.                           The exam was otherwise without abnormality on                            direct and retroflexion views. Complications:            No immediate complications. Estimated Blood Loss:     Estimated blood loss was minimal. Impression:               - Seven 4 to 8 mm polyps in the descending colon,                            in the transverse colon and in the  ascending colon,                            removed with a cold snare. Resected and retrieved.                           - One 1 to 2 mm polyp in the cecum, removed with a                            cold biopsy forceps. Resected and retrieved.                           - Internal hemorrhoids.                           - The examination was otherwise normal on direct                            and retroflexion views.                           - Personal history of colonic polyps. Adenomas -                            last had 2 in 2014 Recommendation:           - Patient has a contact number available for                            emergencies. The signs and symptoms of potential                            delayed complications were discussed with the                            patient. Return to normal activities tomorrow.                            Written discharge instructions were provided to the                            patient.                           - Resume previous diet.                           - Continue present medications.                           - Repeat colonoscopy is recommended for                            surveillance. The colonoscopy date will be  determined after pathology results from today's                            exam become available for review.                           Sh[place] Bridge in 3 years Gatha Mayer, MD 12/07/2017 10:42:22  AM This report has been signed electronically.

## 2017-12-07 NOTE — Progress Notes (Signed)
Report given to PACU, vss 

## 2017-12-07 NOTE — Patient Instructions (Addendum)
I found and removed 7 polyps today. All look benign. I will let you know pathology results and when to have another routine colonoscopy by mail and/or My Chart. Should be in 2022.  I also saw mildly enlarged internal hemorrhoids. If you have hemorrhoid problems (swelling, itching, bleeding) I am able to treat those with an in-office procedure. If you like, please call my office at [phone] to schedule an appointment and I can evaluate you further.  I appreciate the opportunity to care for you. Gatha Mayer, MD, [facility]  Polyp handout given to patient. Hemorrhoid handout given to patient.  Resume previous diet. Continue present medications.  YOU HAD AN ENDOSCOPIC PROCEDURE TODAY AT [facility]:   Refer to the procedure report that was given to you for any specific questions about what was found during the examination.  If the procedure report does not answer your questions, please call your gastroenterologist to clarify.  If you requested that your care partner not be given the details of your procedure findings, then the procedure report has been included in a sealed envelope for you to review at your convenience later.  YOU SHOULD EXPECT: Some feelings of bloating in the abdomen. Passage of more gas than usual.  Walking can help get rid of the air that was put into your GI tract during the procedure and reduce the bloating. If you had a lower endoscopy (such as a colonoscopy or flexible sigmoidoscopy) you may notice spotting of blood in your stool or on the toilet paper. If you underwent a bowel prep for your procedure, you may not have a normal bowel movement for a few days.  Please Note:  You might notice some irritation and congestion in your nose or some drainage.  This is from the oxygen used during your procedure.  There is no need for concern and it should clear up in a day or so.  SYMPTOMS TO REPORT IMMEDIATELY:   Following lower endoscopy (colonoscopy or  flexible sigmoidoscopy):  Excessive amounts of blood in the stool  Significant tenderness or worsening of abdominal pains  Swelling of the abdomen that is new, acute  Fever of 100F or higher   For urgent or emergent issues, a gastroenterologist can be reached at any hour by calling [phone].   DIET:  We do recommend a small meal at first, but then you may proceed to your regular diet.  Drink plenty of fluids but you should avoid alcoholic beverages for 24 hours.  ACTIVITY:  You should plan to take it easy for the rest of today and you should NOT DRIVE or use heavy machinery until tomorrow (because of the sedation medicines used during the test).    FOLLOW UP: Our staff will call the number listed on your records the next business day following your procedure to check on you and address any questions or concerns that you may have regarding the information given to you following your procedure. If we do not reach you, we will leave a message.  However, if you are feeling well and you are not experiencing any problems, there is no need to return our call.  We will assume that you have returned to your regular daily activities without incident.  If any biopsies were taken you will be contacted by phone or by letter within the next 1-3 weeks.  Please call us at [phone] if you have not heard about the biopsies in 3 weeks.    SIGNATURES/CONFIDENTIALITY:  You and/or your care partner have signed paperwork which will be entered into your electronic medical record.  These signatures attest to the fact that that the information above on your After Visit Summary has been reviewed and is understood.  Full responsibility of the confidentiality of this discharge information lies with you and/or your care-partner.

## 2017-12-08 ENCOUNTER — Telehealth: Payer: Self-pay | Admitting: *Deleted

## 2017-12-08 NOTE — Telephone Encounter (Signed)
  Follow up Call-  Call back number 12/07/2017  Post procedure Call Back phone  # [phone]  Permission to leave phone message Yes  Some recent data might be hidden     Patient questions:  Do you have a fever, pain , or abdominal swelling? No. Pain Score  0 *  Have you tolerated food without any problems? Yes.    Have you been able to return to your normal activities? Yes.    Do you have any questions about your discharge instructions: Diet   No. Medications  No. Follow up visit  No.  Do you have questions or concerns about your Care? No.  Actions: * If pain score is 4 or above: No action needed, pain <4.

## 2017-12-16 ENCOUNTER — Encounter: Payer: Self-pay | Admitting: Internal Medicine

## 2017-12-16 DIAGNOSIS — Z86010 Personal history of colonic polyps: Secondary | ICD-10-CM

## 2017-12-16 NOTE — Progress Notes (Signed)
7-8 adenomas Recall 2022

## 2018-01-02 ENCOUNTER — Encounter (INDEPENDENT_AMBULATORY_CARE_PROVIDER_SITE_OTHER): Payer: Medicare Other | Admitting: Ophthalmology

## 2018-01-02 DIAGNOSIS — H34232 Retinal artery branch occlusion, left eye: Secondary | ICD-10-CM

## 2018-01-02 DIAGNOSIS — H35372 Puckering of macula, left eye: Secondary | ICD-10-CM | POA: Diagnosis not present

## 2018-01-02 DIAGNOSIS — H43813 Vitreous degeneration, bilateral: Secondary | ICD-10-CM | POA: Diagnosis not present

## 2018-01-02 DIAGNOSIS — I10 Essential (primary) hypertension: Secondary | ICD-10-CM

## 2018-01-02 DIAGNOSIS — H34832 Tributary (branch) retinal vein occlusion, left eye, with macular edema: Secondary | ICD-10-CM

## 2018-01-02 DIAGNOSIS — H2513 Age-related nuclear cataract, bilateral: Secondary | ICD-10-CM

## 2018-01-02 DIAGNOSIS — H35033 Hypertensive retinopathy, bilateral: Secondary | ICD-10-CM

## 2018-02-08 DIAGNOSIS — H524 Presbyopia: Secondary | ICD-10-CM | POA: Diagnosis not present

## 2018-03-30 DIAGNOSIS — Z23 Encounter for immunization: Secondary | ICD-10-CM | POA: Diagnosis not present

## 2018-04-24 ENCOUNTER — Encounter (INDEPENDENT_AMBULATORY_CARE_PROVIDER_SITE_OTHER): Payer: Medicare Other | Admitting: Ophthalmology

## 2018-04-24 DIAGNOSIS — H35033 Hypertensive retinopathy, bilateral: Secondary | ICD-10-CM | POA: Diagnosis not present

## 2018-04-24 DIAGNOSIS — H43813 Vitreous degeneration, bilateral: Secondary | ICD-10-CM

## 2018-04-24 DIAGNOSIS — H34231 Retinal artery branch occlusion, right eye: Secondary | ICD-10-CM

## 2018-04-24 DIAGNOSIS — H34832 Tributary (branch) retinal vein occlusion, left eye, with macular edema: Secondary | ICD-10-CM | POA: Diagnosis not present

## 2018-04-24 DIAGNOSIS — H2513 Age-related nuclear cataract, bilateral: Secondary | ICD-10-CM

## 2018-04-24 DIAGNOSIS — I10 Essential (primary) hypertension: Secondary | ICD-10-CM

## 2018-04-24 DIAGNOSIS — H35372 Puckering of macula, left eye: Secondary | ICD-10-CM

## 2018-08-21 ENCOUNTER — Encounter (INDEPENDENT_AMBULATORY_CARE_PROVIDER_SITE_OTHER): Payer: Medicare Other | Admitting: Ophthalmology

## 2018-08-21 DIAGNOSIS — H35033 Hypertensive retinopathy, bilateral: Secondary | ICD-10-CM | POA: Diagnosis not present

## 2018-08-21 DIAGNOSIS — H34232 Retinal artery branch occlusion, left eye: Secondary | ICD-10-CM | POA: Diagnosis not present

## 2018-08-21 DIAGNOSIS — I10 Essential (primary) hypertension: Secondary | ICD-10-CM | POA: Diagnosis not present

## 2018-08-21 DIAGNOSIS — H43813 Vitreous degeneration, bilateral: Secondary | ICD-10-CM

## 2018-08-21 DIAGNOSIS — H2513 Age-related nuclear cataract, bilateral: Secondary | ICD-10-CM

## 2018-08-21 DIAGNOSIS — H34832 Tributary (branch) retinal vein occlusion, left eye, with macular edema: Secondary | ICD-10-CM

## 2018-08-28 DIAGNOSIS — Z125 Encounter for screening for malignant neoplasm of prostate: Secondary | ICD-10-CM | POA: Diagnosis not present

## 2018-08-28 DIAGNOSIS — I10 Essential (primary) hypertension: Secondary | ICD-10-CM | POA: Diagnosis not present

## 2018-08-31 DIAGNOSIS — I10 Essential (primary) hypertension: Secondary | ICD-10-CM | POA: Diagnosis not present

## 2018-08-31 DIAGNOSIS — I6523 Occlusion and stenosis of bilateral carotid arteries: Secondary | ICD-10-CM | POA: Diagnosis not present

## 2018-08-31 DIAGNOSIS — Z6836 Body mass index (BMI) 36.0-36.9, adult: Secondary | ICD-10-CM | POA: Diagnosis not present

## 2018-08-31 DIAGNOSIS — Z0000 Encounter for general adult medical examination without abnormal findings: Secondary | ICD-10-CM | POA: Diagnosis not present

## 2018-12-18 ENCOUNTER — Encounter (INDEPENDENT_AMBULATORY_CARE_PROVIDER_SITE_OTHER): Payer: Medicare Other | Admitting: Ophthalmology

## 2019-04-22 DIAGNOSIS — Z23 Encounter for immunization: Secondary | ICD-10-CM | POA: Diagnosis not present

## 2019-05-02 ENCOUNTER — Other Ambulatory Visit: Payer: Self-pay

## 2019-05-02 DIAGNOSIS — Z20822 Contact with and (suspected) exposure to covid-19: Secondary | ICD-10-CM

## 2019-05-04 LAB — NOVEL CORONAVIRUS, NAA: SARS-CoV-2, NAA: NOT DETECTED

## 2019-08-11 ENCOUNTER — Ambulatory Visit: Payer: Medicare Other | Attending: Internal Medicine

## 2019-08-11 DIAGNOSIS — Z23 Encounter for immunization: Secondary | ICD-10-CM | POA: Insufficient documentation

## 2019-08-11 NOTE — Progress Notes (Signed)
   Covid-19 Vaccination Clinic  Name:  Wesley Trujillo    MRN: [id number] DOB: Oct 22, 1945  08/11/2019  [name] was observed post Covid-19 immunization for 15 minutes without incidence. He was provided with Vaccine Information Sheet and instruction to access the V-Safe system.   [name] was instructed to call 911 with any severe reactions post vaccine: Marland Kitchen Difficulty breathing  . Swelling of your face and throat  . A fast heartbeat  . A bad rash all over your body  . Dizziness and weakness    Immunizations Administered    Name Date Dose VIS Date Route   Pfizer COVID-19 Vaccine 08/11/2019  9:14 AM 0.3 mL 06/07/2019 Intramuscular   Manufacturer: [place]   Lot: [id number]   [place]: [id number]

## 2019-09-02 DIAGNOSIS — I10 Essential (primary) hypertension: Secondary | ICD-10-CM | POA: Diagnosis not present

## 2019-09-02 DIAGNOSIS — Z125 Encounter for screening for malignant neoplasm of prostate: Secondary | ICD-10-CM | POA: Diagnosis not present

## 2019-09-03 ENCOUNTER — Ambulatory Visit: Payer: Medicare Other | Attending: Internal Medicine

## 2019-09-03 DIAGNOSIS — Z23 Encounter for immunization: Secondary | ICD-10-CM | POA: Insufficient documentation

## 2019-09-03 NOTE — Progress Notes (Signed)
   Covid-19 Vaccination Clinic  Name:  Wesley Trujillo    MRN: [id number] DOB: 03/11/1946  09/03/2019  [name] was observed post Covid-19 immunization for 15 minutes without incident. He was provided with Vaccine Information Sheet and instruction to access the V-Safe system.   [name] was instructed to call 911 with any severe reactions post vaccine: Marland Kitchen Difficulty breathing  . Swelling of face and throat  . A fast heartbeat  . A bad rash all over body  . Dizziness and weakness   Immunizations Administered    Name Date Dose VIS Date Route   Pfizer COVID-19 Vaccine 09/03/2019  8:49 AM 0.3 mL 06/07/2019 Intramuscular   Manufacturer: [place]   Lot: [id number]   [place]: [id number]

## 2019-09-23 DIAGNOSIS — Z86010 Personal history of colonic polyps: Secondary | ICD-10-CM | POA: Diagnosis not present

## 2019-09-23 DIAGNOSIS — N281 Cyst of kidney, acquired: Secondary | ICD-10-CM | POA: Diagnosis not present

## 2019-09-23 DIAGNOSIS — I10 Essential (primary) hypertension: Secondary | ICD-10-CM | POA: Diagnosis not present

## 2019-09-23 DIAGNOSIS — Z0001 Encounter for general adult medical examination with abnormal findings: Secondary | ICD-10-CM | POA: Diagnosis not present

## 2019-11-05 DIAGNOSIS — H348322 Tributary (branch) retinal vein occlusion, left eye, stable: Secondary | ICD-10-CM | POA: Diagnosis not present

## 2019-11-05 DIAGNOSIS — H353131 Nonexudative age-related macular degeneration, bilateral, early dry stage: Secondary | ICD-10-CM | POA: Diagnosis not present

## 2020-04-24 DIAGNOSIS — Z23 Encounter for immunization: Secondary | ICD-10-CM | POA: Diagnosis not present

## 2020-10-05 DIAGNOSIS — R739 Hyperglycemia, unspecified: Secondary | ICD-10-CM | POA: Diagnosis not present

## 2020-10-05 DIAGNOSIS — Z125 Encounter for screening for malignant neoplasm of prostate: Secondary | ICD-10-CM | POA: Diagnosis not present

## 2020-10-05 DIAGNOSIS — I10 Essential (primary) hypertension: Secondary | ICD-10-CM | POA: Diagnosis not present

## 2020-10-12 DIAGNOSIS — R7303 Prediabetes: Secondary | ICD-10-CM | POA: Diagnosis not present

## 2020-10-12 DIAGNOSIS — I10 Essential (primary) hypertension: Secondary | ICD-10-CM | POA: Diagnosis not present

## 2020-10-12 DIAGNOSIS — Z0001 Encounter for general adult medical examination with abnormal findings: Secondary | ICD-10-CM | POA: Diagnosis not present

## 2020-10-12 DIAGNOSIS — I6523 Occlusion and stenosis of bilateral carotid arteries: Secondary | ICD-10-CM | POA: Diagnosis not present

## 2020-10-22 DIAGNOSIS — R7303 Prediabetes: Secondary | ICD-10-CM | POA: Diagnosis not present

## 2020-10-29 DIAGNOSIS — I10 Essential (primary) hypertension: Secondary | ICD-10-CM | POA: Diagnosis not present

## 2020-10-29 DIAGNOSIS — Z6837 Body mass index (BMI) 37.0-37.9, adult: Secondary | ICD-10-CM | POA: Diagnosis not present

## 2020-10-29 DIAGNOSIS — R7303 Prediabetes: Secondary | ICD-10-CM | POA: Diagnosis not present

## 2020-11-12 DIAGNOSIS — R7303 Prediabetes: Secondary | ICD-10-CM | POA: Diagnosis not present

## 2020-11-30 ENCOUNTER — Telehealth: Payer: Self-pay

## 2020-11-30 NOTE — Telephone Encounter (Addendum)
Called the patient's home number x 2. The phone rings, then turns to a busy signal. Called the patient's cell phone. No answer. Left him a message.  Dr Carlean Purl will do the colonoscopy at 9:30 am on 12/29/20.  Patient originally scheduled for 11:00 am.  If this is not okay for the patient, I have asked he call back and ask for me.

## 2020-12-03 DIAGNOSIS — H35032 Hypertensive retinopathy, left eye: Secondary | ICD-10-CM | POA: Diagnosis not present

## 2020-12-03 DIAGNOSIS — H2513 Age-related nuclear cataract, bilateral: Secondary | ICD-10-CM | POA: Diagnosis not present

## 2020-12-16 ENCOUNTER — Other Ambulatory Visit: Payer: Self-pay

## 2020-12-16 ENCOUNTER — Ambulatory Visit (AMBULATORY_SURGERY_CENTER): Payer: Medicare Other | Admitting: *Deleted

## 2020-12-16 VITALS — Ht 73.0 in | Wt 281.0 lb

## 2020-12-16 DIAGNOSIS — Z86010 Personal history of colonic polyps: Secondary | ICD-10-CM

## 2020-12-16 NOTE — Progress Notes (Signed)

## 2020-12-29 ENCOUNTER — Other Ambulatory Visit: Payer: Self-pay

## 2020-12-29 ENCOUNTER — Encounter: Payer: Medicare Other | Admitting: Internal Medicine

## 2020-12-29 ENCOUNTER — Encounter: Payer: Self-pay | Admitting: Internal Medicine

## 2020-12-29 ENCOUNTER — Ambulatory Visit (AMBULATORY_SURGERY_CENTER): Payer: Medicare Other | Admitting: Internal Medicine

## 2020-12-29 VITALS — BP 129/86 | HR 56 | Temp 96.8°F | Resp 9 | Ht 73.0 in | Wt 281.0 lb

## 2020-12-29 DIAGNOSIS — Z1211 Encounter for screening for malignant neoplasm of colon: Secondary | ICD-10-CM | POA: Diagnosis not present

## 2020-12-29 DIAGNOSIS — Z86010 Personal history of colonic polyps: Secondary | ICD-10-CM

## 2020-12-29 DIAGNOSIS — D122 Benign neoplasm of ascending colon: Secondary | ICD-10-CM | POA: Diagnosis not present

## 2020-12-29 DIAGNOSIS — D123 Benign neoplasm of transverse colon: Secondary | ICD-10-CM | POA: Diagnosis not present

## 2020-12-29 MED ORDER — SODIUM CHLORIDE 0.9 % IV SOLN: 500.0000 mL | Freq: Once | INTRAVENOUS | Status: DC

## 2020-12-29 NOTE — Progress Notes (Signed)
Pt's states no medical or surgical changes since previsit or office visit. 

## 2020-12-29 NOTE — Progress Notes (Signed)
Called to room to assist during endoscopic procedure.  Patient ID and intended procedure confirmed with present staff. Received instructions for my participation in the procedure from the performing physician.  

## 2020-12-29 NOTE — Progress Notes (Signed)
PT taken to PACU. Monitors in place. VSS. Report given to RN. 

## 2020-12-29 NOTE — Op Note (Signed)
[facility] Patient Name: Wesley Trujillo Procedure Date: 12/29/2020 10:00 AM MRN: [id number] Endoscopist: Gatha Mayer , MD Age: 75 Referring MD:  Date of Birth: 12/06/1945 Gender: Male Account #: [id number] Procedure:                Colonoscopy Indications:              Surveillance: Personal history of adenomatous                            polyps on last colonoscopy 3 years ago Medicines:                Propofol per Anesthesia, Monitored Anesthesia Care Procedure:                Pre-Anesthesia Assessment:                           - Prior to the procedure, a History and Physical                            was performed, and patient medications and                            allergies were reviewed. The patient's tolerance of                            previous anesthesia was also reviewed. The risks                            and benefits of the procedure and the sedation                            options and risks were discussed with the patient.                            All questions were answered, and informed consent                            was obtained. Prior Anticoagulants: The patient has                            taken no previous anticoagulant or antiplatelet                            agents. ASA Grade Assessment: II - A patient with                            mild systemic disease. After reviewing the risks                            and benefits, the patient was deemed in                            satisfactory condition to undergo the procedure.  After obtaining informed consent, the colonoscope                            was passed under direct vision. Throughout the                            procedure, the patient's blood pressure, pulse, and                            oxygen saturations were monitored continuously. The                            CF [id number] #[id number] was introduced through the anus                            and  advanced to the the cecum, identified by                            appendiceal orifice and ileocecal valve. The                            patient tolerated the procedure well. The quality                            of the bowel preparation was good. The colonoscopy                            was somewhat difficult due to significant looping.                            Successful completion of the procedure was aided by                            applying abdominal pressure. The bowel preparation                            used was Miralax via split dose instruction. The                            ileocecal valve, appendiceal orifice, and rectum                            were photographed. Scope In: 10:24:18 AM Scope Out: 10:43:10 AM Scope Withdrawal Time: 0 hours 15 minutes 56 seconds  Total Procedure Duration: 0 hours 18 minutes 52 seconds  Findings:                 The perianal and digital rectal examinations were                            normal. Pertinent negatives include normal prostate                            (size, shape, and consistency).  Two sessile polyps were found in the transverse                            colon and ascending colon. The polyps were 1 to 2                            mm in size. These polyps were removed with a cold                            biopsy forceps. Resection and retrieval were                            complete. Verification of patient identification                            for the specimen was done. Estimated blood loss was                            minimal.                           Internal hemorrhoids were found.                           The exam was otherwise without abnormality on                            direct and retroflexion views. Complications:            No immediate complications. Estimated Blood Loss:     Estimated blood loss was minimal. Impression:               - Two 1 to 2 mm polyps in the  transverse colon and                            in the ascending colon, removed with a cold biopsy                            forceps. Resected and retrieved.                           - Internal hemorrhoids.                           - The examination was otherwise normal on direct                            and retroflexion views.                           - Personal history of colonic polyps. 2005 - 5                            polyps, one TV adenoma others adenomas  2008 - no polyps                           08/30/2012 - 2 diminutive right colon polyps adenomas                           12/07/2017 7 polyps removed - max 8 mm adenomas Recommendation:           - Patient has a contact number available for                            emergencies. The signs and symptoms of potential                            delayed complications were discussed with the                            patient. Return to normal activities tomorrow.                            Written discharge instructions were provided to the                            patient.                           - Resume previous diet.                           - Continue present medications.                           - No recommendation at this time regarding repeat                            colonoscopy due to age.                           - Await pathology results. Gatha Mayer, MD 12/29/2020 10:50:56 AM This report has been signed electronically.

## 2020-12-29 NOTE — Patient Instructions (Addendum)
I found and removed 2 tiny polyps. I will have them analyzed but do not anticipate recommending a routine repeat colonoscopy.  I appreciate the opportunity to care for you. Gatha Mayer, MD, [facility]  Handouts given on polyps and hemorrhoids  YOU HAD AN ENDOSCOPIC PROCEDURE TODAY AT [facility]:   Refer to the procedure report that was given to you for any specific questions about what was found during the examination.  If the procedure report does not answer your questions, please call your gastroenterologist to clarify.  If you requested that your care partner not be given the details of your procedure findings, then the procedure report has been included in a sealed envelope for you to review at your convenience later.  YOU SHOULD EXPECT: Some feelings of bloating in the abdomen. Passage of more gas than usual.  Walking can help get rid of the air that was put into your GI tract during the procedure and reduce the bloating. If you had a lower endoscopy (such as a colonoscopy or flexible sigmoidoscopy) you may notice spotting of blood in your stool or on the toilet paper. If you underwent a bowel prep for your procedure, you may not have a normal bowel movement for a few days.  Please Note:  You might notice some irritation and congestion in your nose or some drainage.  This is from the oxygen used during your procedure.  There is no need for concern and it should clear up in a day or so.  SYMPTOMS TO REPORT IMMEDIATELY:  Following lower endoscopy (colonoscopy or flexible sigmoidoscopy):  Excessive amounts of blood in the stool  Significant tenderness or worsening of abdominal pains  Swelling of the abdomen that is new, acute  Fever of 100F or higher   For urgent or emergent issues, a gastroenterologist can be reached at any hour by calling [phone]. Do not use MyChart messaging for urgent concerns.   DIET:  We do recommend a small meal at first, but then you may  proceed to your regular diet.  Drink plenty of fluids but you should avoid alcoholic beverages for 24 hours.  ACTIVITY:  You should plan to take it easy for the rest of today and you should NOT DRIVE or use heavy machinery until tomorrow (because of the sedation medicines used during the test).    FOLLOW UP: Our staff will call the number listed on your records 48-72 hours following your procedure to check on you and address any questions or concerns that you may have regarding the information given to you following your procedure. If we do not reach you, we will leave a message.  We will attempt to reach you two times.  During this call, we will ask if you have developed any symptoms of COVID 19. If you develop any symptoms (ie: fever, flu-like symptoms, shortness of breath, cough etc.) before then, please call [phone].  If you test positive for Covid 19 in the 2 weeks post procedure, please call and report this information to [place].    If any biopsies were taken you will be contacted by phone or by letter within the next 1-3 weeks.  Please call us at [phone] if you have not heard about the biopsies in 3 weeks.    SIGNATURES/CONFIDENTIALITY: You and/or your care partner have signed paperwork which will be entered into your electronic medical record.  These signatures attest to the fact that that the information above on your After Visit  Summary has been reviewed and is understood.  Full responsibility of the confidentiality of this discharge information lies with you and/or your care-partner.  

## 2020-12-31 ENCOUNTER — Telehealth: Payer: Self-pay

## 2020-12-31 NOTE — Telephone Encounter (Signed)
  Follow up Call-  Call back number 12/29/2020  Post procedure Call Back phone  # [phone]  Permission to leave phone message Yes  Some recent data might be hidden     Patient questions:  Do you have a fever, pain , or abdominal swelling? No. Pain Score  0 *  Have you tolerated food without any problems? Yes.    Have you been able to return to your normal activities? Yes.    Do you have any questions about your discharge instructions: Diet   No. Medications  No. Follow up visit  No.  Do you have questions or concerns about your Care? No.  Actions: * If pain score is 4 or above: No action needed, pain <4.

## 2021-01-14 ENCOUNTER — Encounter: Payer: Self-pay | Admitting: Internal Medicine

## 2021-03-05 DIAGNOSIS — H348322 Tributary (branch) retinal vein occlusion, left eye, stable: Secondary | ICD-10-CM | POA: Diagnosis not present

## 2021-03-05 DIAGNOSIS — H35032 Hypertensive retinopathy, left eye: Secondary | ICD-10-CM | POA: Diagnosis not present

## 2021-03-19 DIAGNOSIS — Z23 Encounter for immunization: Secondary | ICD-10-CM | POA: Diagnosis not present

## 2021-10-11 DIAGNOSIS — R739 Hyperglycemia, unspecified: Secondary | ICD-10-CM | POA: Diagnosis not present

## 2021-10-11 DIAGNOSIS — I10 Essential (primary) hypertension: Secondary | ICD-10-CM | POA: Diagnosis not present

## 2021-10-11 DIAGNOSIS — Z125 Encounter for screening for malignant neoplasm of prostate: Secondary | ICD-10-CM | POA: Diagnosis not present

## 2021-10-14 ENCOUNTER — Other Ambulatory Visit: Payer: Self-pay | Admitting: Internal Medicine

## 2021-10-14 DIAGNOSIS — Z0000 Encounter for general adult medical examination without abnormal findings: Secondary | ICD-10-CM | POA: Diagnosis not present

## 2021-10-14 DIAGNOSIS — R7303 Prediabetes: Secondary | ICD-10-CM | POA: Diagnosis not present

## 2021-10-14 DIAGNOSIS — I10 Essential (primary) hypertension: Secondary | ICD-10-CM | POA: Diagnosis not present

## 2021-10-14 DIAGNOSIS — I6523 Occlusion and stenosis of bilateral carotid arteries: Secondary | ICD-10-CM | POA: Diagnosis not present

## 2021-11-09 ENCOUNTER — Ambulatory Visit
Admission: RE | Admit: 2021-11-09 | Discharge: 2021-11-09 | Disposition: A | Discharge status: Home / Self Care | Payer: No Typology Code available for payment source | Source: Ambulatory Visit | Attending: Internal Medicine | Admitting: Internal Medicine

## 2021-11-09 DIAGNOSIS — I10 Essential (primary) hypertension: Secondary | ICD-10-CM

## 2021-11-19 DIAGNOSIS — H353131 Nonexudative age-related macular degeneration, bilateral, early dry stage: Secondary | ICD-10-CM | POA: Diagnosis not present

## 2021-12-27 DIAGNOSIS — I2510 Atherosclerotic heart disease of native coronary artery without angina pectoris: Secondary | ICD-10-CM | POA: Diagnosis not present

## 2022-01-27 ENCOUNTER — Ambulatory Visit: Payer: Medicare Other | Admitting: Cardiology

## 2022-01-27 ENCOUNTER — Encounter: Payer: Self-pay | Admitting: Cardiology

## 2022-01-27 VITALS — BP 137/96 | HR 66 | Temp 97.6°F | Resp 16 | Ht 73.0 in | Wt 286.0 lb

## 2022-01-27 DIAGNOSIS — R0609 Other forms of dyspnea: Secondary | ICD-10-CM | POA: Diagnosis not present

## 2022-01-27 DIAGNOSIS — I10 Essential (primary) hypertension: Secondary | ICD-10-CM | POA: Diagnosis not present

## 2022-01-27 DIAGNOSIS — R931 Abnormal findings on diagnostic imaging of heart and coronary circulation: Secondary | ICD-10-CM | POA: Diagnosis not present

## 2022-01-27 DIAGNOSIS — E7800 Pure hypercholesterolemia, unspecified: Secondary | ICD-10-CM

## 2022-01-27 DIAGNOSIS — I25118 Atherosclerotic heart disease of native coronary artery with other forms of angina pectoris: Secondary | ICD-10-CM | POA: Diagnosis not present

## 2022-01-27 DIAGNOSIS — E6609 Other obesity due to excess calories: Secondary | ICD-10-CM

## 2022-01-27 MED ORDER — OLMESARTAN MEDOXOMIL-HCTZ 20-12.5 MG PO TABS: 1.0000 | ORAL_TABLET | ORAL | 3 refills | Status: DC

## 2022-01-27 NOTE — Progress Notes (Signed)
Primary Physician/Referring:  Deland Pretty, MD  Patient ID: Wesley Trujillo, male    DOB: [birth year], 76 y.o.   MRN: [id number]  Chief Complaint  Patient presents with   Agatson score over 1500   New Patient (Initial Visit)    Referred by Deland Pretty, MD   HPI:    Wesley Trujillo  is a 76 y.o. Patient with primary hypertension, stage IIIa chronic kidney disease, moderate obesity, hyperglycemia is referred to me for evaluation of coronary artery disease.  Remote carotid duplex in 2016 anterior mild bilateral atherosclerotic changes.  He underwent coronary calcium scoring on 11/10/2021 revealing borderline ascending aortic dilatation, coronary calcium score markedly elevated in the 87 percentile and hence referred to me for further evaluation and risk stratification.  Patient initially stated he is doing well and has no symptoms but on further questioning, over the past 2 years he has noticed reduced physical activity and dyspnea on exertion.  Denies any chest pain or palpitations, dizziness or syncope.  Past Medical History:  Diagnosis Date   Chronic kidney disease    blood levels slightly elevated, takes HCTZ for this   Hyperlipidemia    Kidney stones    Personal history of colonic adenomas 08/30/2012   Poison ivy    Past Surgical History:  Procedure Laterality Date   CHOLECYSTECTOMY     COLONOSCOPY  12/07/2017   Family History  Problem Relation Age of Onset   Colon cancer Neg Hx    Esophageal cancer Neg Hx    Rectal cancer Neg Hx    Stomach cancer Neg Hx    Colon polyps Neg Hx     Social History   Tobacco Use   Smoking status: Never   Smokeless tobacco: Never  Substance Use Topics   Alcohol use: Yes    Comment: socially   Marital Status: Married  ROS  Review of Systems  Cardiovascular:  Positive for dyspnea on exertion. Negative for chest pain and leg swelling.   Objective  Blood pressure (!) 137/96, pulse 66, temperature 97.6 F (36.4 C), temperature source  Temporal, resp. rate 16, height _0  (1.854 m), weight 286 lb (129.7 kg), SpO2 98 %. Body mass index is 37.73 kg/m.     01/27/2022    9:14 AM 12/29/2020   11:06 AM 12/29/2020   10:56 AM  Vitals with BMI  Height _1     Weight 286 lbs    BMI 56.38    Systolic 756 433 295  Diastolic 96 86 79  Pulse 66 56 60    Physical Exam Constitutional:      Appearance: He is obese.  Neck:     Vascular: No carotid bruit or JVD.  Cardiovascular:     Rate and Rhythm: Normal rate and regular rhythm.     Pulses: Intact distal pulses.     Heart sounds: Normal heart sounds. No murmur heard.    No gallop.  Pulmonary:     Effort: Pulmonary effort is normal.     Breath sounds: Normal breath sounds.  Abdominal:     General: Bowel sounds are normal.     Palpations: Abdomen is soft.  Musculoskeletal:     Right lower leg: No edema.     Left lower leg: No edema.     Medications and allergies   Allergies  Allergen Reactions   Sulfa Antibiotics     Turned red     Medication list after today's encounter   Current Outpatient Medications:  aspirin EC 81 MG tablet, Take 81 mg by mouth daily. , Disp: , Rfl:    atorvastatin (LIPITOR) 20 MG tablet, Take 1 tablet by mouth daily., Disp: , Rfl:    olmesartan-hydrochlorothiazide (BENICAR HCT) 20-12.5 MG tablet, Take 1 tablet by mouth every morning., Disp: 30 tablet, Rfl: 3  Laboratory examination:   External labs:   Labs 10/11/2021:  A1c 6.0%.  Total cholesterol 114, triglycerides 164, HDL 39, LDL 42.  Hb 15.5/HCT 45.5, platelets 223.  BUN 16, creatinine 1.31, EGFR 53 mL, LFTs normal.  Urine analysis is normal.  Radiology:   Coronary calcium score 11/09/2021: LM 0 LAD farthing 59 LCx 118 RCA 875. Total Agatston score 1152. MESA database percentile: 87 Ascending aorta 40 mm, descending aorta 31 mm.  Tortuosity of the descending aorta.  No other significant extracardiac abnormality.  Cardiac Studies:   NA  EKG:   EKG 01/27/2022: Normal  sinus rhythm at rate of 65 bpm, normal axis, poor R wave progression, probably normal variant.  No evidence of ischemia.  PACs (2).  Low-voltage complexes, consider pulmonary disease pattern.    Assessment     ICD-10-CM   1. Coronary artery disease involving native coronary artery of native heart with other form of angina pectoris ([place])  I25.118 olmesartan-hydrochlorothiazide (BENICAR HCT) 20-12.5 MG tablet    PCV ECHOCARDIOGRAM COMPLETE    PCV MYOCARDIAL PERFUSION WO LEXISCAN    2. Elevated coronary artery calcium score 11/09/2021: Total Agatston score 1152. MESA database percentile: 87  R93.1 EKG 12-Lead    3. Primary hypertension  I10 olmesartan-hydrochlorothiazide (BENICAR HCT) 20-12.5 MG tablet    Basic metabolic panel    4. Dyspnea on exertion  R06.09 PCV ECHOCARDIOGRAM COMPLETE    PCV MYOCARDIAL PERFUSION WO LEXISCAN    5. Pure hypercholesterolemia  E78.00     6. Class 2 obesity due to excess calories without serious comorbidity with body mass index (BMI) of 37.0 to 37.9 in adult  E66.09    Z68.37        Orders Placed This Encounter  Procedures   Basic metabolic panel   PCV MYOCARDIAL PERFUSION WO LEXISCAN    2 day protocol    Standing Status:   Future    Standing Expiration Date:   03/29/2022    Scheduling Instructions:     2 day protocol   EKG 12-Lead   PCV ECHOCARDIOGRAM COMPLETE    Standing Status:   Future    Standing Expiration Date:   01/28/2023    Meds ordered this encounter  Medications   olmesartan-hydrochlorothiazide (BENICAR HCT) 20-12.5 MG tablet    Sig: Take 1 tablet by mouth every morning.    Dispense:  30 tablet    Refill:  3    Discontinue Maxzide    Medications Discontinued During This Encounter  Medication Reason   triamterene-hydrochlorothiazide (MAXZIDE-25) 37.5-25 MG tablet Change in therapy     Recommendations:   Wesley Trujillo is a 76 y.o.  Patient with primary hypertension, stage IIIa chronic kidney disease, moderate obesity,  hyperglycemia is referred to me for evaluation of coronary artery disease.  Remote carotid duplex in 2016 anterior mild bilateral atherosclerotic changes.  He underwent coronary calcium scoring on 11/10/2021 revealing borderline ascending aortic dilatation, coronary calcium score markedly elevated in the 87 percentile and hence referred to me for further evaluation and risk stratification.  Patient has noticed decreased physical endurance and dyspnea on exertion over the past 2 years.  He has markedly elevated coronary calcium score  and has underlying coronary artery disease and his dyspnea could be anginal equivalent.  He also has sedentary lifestyle.  We will schedule him for 2-day nuclear stress test, exercise nuclear stress test, will also obtain an echocardiogram.  Blood pressure is elevated, he states that his diastolic blood pressure has been around 90 mmHg at home as well.  Will discontinue Maxide and switch him to olmesartan HCT 20/12.5 mg in the morning.  Will obtain BMP in 2 weeks.  Hypertension probably contributed by his obesity as well.   Labs reviewed, lipids under excellent control.  His cardiac examination and vascular examination is normal except for mildly reduced DP pulses but bounding PT pulses and popliteal pulses.  I will see him back in 6 to 8 weeks for follow-up.    Adrian Prows, MD, [facility] 01/27/2022, 10:33 AM Office: [phone]

## 2022-02-17 DIAGNOSIS — I10 Essential (primary) hypertension: Secondary | ICD-10-CM | POA: Diagnosis not present

## 2022-02-18 LAB — BASIC METABOLIC PANEL
BUN/Creatinine Ratio: 13 (ref 10–24)
BUN: 15 mg/dL (ref 8–27)
CO2: 24 mmol/L (ref 20–29)
Calcium: 9.4 mg/dL (ref 8.6–10.2)
Chloride: 104 mmol/L (ref 96–106)
Creatinine, Ser: 1.2 mg/dL (ref 0.76–1.27)
Glucose: 133 mg/dL — ABNORMAL HIGH (ref 70–99)
Potassium: 3.8 mmol/L (ref 3.5–5.2)
Sodium: 143 mmol/L (ref 134–144)
eGFR: 63 mL/min/{1.73_m2} (ref >59)

## 2022-02-21 ENCOUNTER — Ambulatory Visit: Payer: Medicare Other

## 2022-02-21 DIAGNOSIS — I25118 Atherosclerotic heart disease of native coronary artery with other forms of angina pectoris: Secondary | ICD-10-CM | POA: Diagnosis not present

## 2022-02-21 DIAGNOSIS — R0609 Other forms of dyspnea: Secondary | ICD-10-CM | POA: Diagnosis not present

## 2022-02-23 ENCOUNTER — Other Ambulatory Visit: Payer: Medicare Other

## 2022-03-03 ENCOUNTER — Ambulatory Visit: Payer: Medicare Other

## 2022-03-03 DIAGNOSIS — Z01810 Encounter for preprocedural cardiovascular examination: Secondary | ICD-10-CM | POA: Diagnosis not present

## 2022-03-03 DIAGNOSIS — I25118 Atherosclerotic heart disease of native coronary artery with other forms of angina pectoris: Secondary | ICD-10-CM

## 2022-03-03 DIAGNOSIS — R0609 Other forms of dyspnea: Secondary | ICD-10-CM

## 2022-03-03 DIAGNOSIS — I447 Left bundle-branch block, unspecified: Secondary | ICD-10-CM | POA: Diagnosis not present

## 2022-03-03 DIAGNOSIS — I10 Essential (primary) hypertension: Secondary | ICD-10-CM | POA: Diagnosis not present

## 2022-03-11 ENCOUNTER — Encounter: Payer: Self-pay | Admitting: Cardiology

## 2022-03-11 ENCOUNTER — Ambulatory Visit: Payer: Medicare Other | Admitting: Cardiology

## 2022-03-11 VITALS — BP 127/71 | HR 57 | Temp 97.8°F | Resp 16 | Ht 73.0 in | Wt 284.4 lb

## 2022-03-11 DIAGNOSIS — N1831 Chronic kidney disease, stage 3a: Secondary | ICD-10-CM

## 2022-03-11 DIAGNOSIS — I77810 Thoracic aortic ectasia: Secondary | ICD-10-CM

## 2022-03-11 DIAGNOSIS — I25118 Atherosclerotic heart disease of native coronary artery with other forms of angina pectoris: Secondary | ICD-10-CM

## 2022-03-11 DIAGNOSIS — E7800 Pure hypercholesterolemia, unspecified: Secondary | ICD-10-CM | POA: Diagnosis not present

## 2022-03-11 DIAGNOSIS — R739 Hyperglycemia, unspecified: Secondary | ICD-10-CM

## 2022-03-11 DIAGNOSIS — I10 Essential (primary) hypertension: Secondary | ICD-10-CM

## 2022-03-11 MED ORDER — SEMAGLUTIDE(0.25 OR 0.5MG/DOS) 2 MG/1.5ML ~~LOC~~ SOPN
0.2500 mg | PEN_INJECTOR | Freq: Once | SUBCUTANEOUS | Status: AC
  Administered 2022-03-11: 0.25 mg via SUBCUTANEOUS

## 2022-03-11 MED ORDER — SEMAGLUTIDE(0.25 OR 0.5MG/DOS) 2 MG/3ML ~~LOC~~ SOPN: 0.5000 mg | PEN_INJECTOR | SUBCUTANEOUS | 1 refills | Status: DC

## 2022-03-11 MED ORDER — OLMESARTAN MEDOXOMIL-HCTZ 20-12.5 MG PO TABS: 1.0000 | ORAL_TABLET | ORAL | 3 refills | Status: DC

## 2022-03-11 NOTE — Progress Notes (Signed)
Primary Physician/Referring:  Deland Pretty, MD  Patient ID: Wesley Trujillo, male    DOB: March 15, 1946, 76 y.o.   MRN: [id number]  Chief Complaint  Patient presents with   Coronary Artery Disease   Hypertension   Follow-up    6 week   HPI:    Wesley Trujillo  is a 76 y.o. Patient with primary hypertension, stage IIIa chronic kidney disease, moderate obesity, hyperglycemia is referred to me for evaluation of coronary artery disease.  Remote carotid duplex in 2016 anterior mild bilateral atherosclerotic changes.  He underwent coronary calcium scoring on 11/10/2021 revealing borderline ascending aortic dilatation, coronary calcium score markedly elevated in the 87 percentile and hence referred to me for further evaluation and risk stratification.  I had seen him 6 weeks ago.  I had changed him to olmesartan HCT in view of uncontrolled hypertension and aortic root dilatation has enough to do for which is tolerating.  Over the past 2 years he has noticed reduced physical activity and dyspnea on exertion.  Denies any chest pain or palpitations, dizziness or syncope.  Past Medical History:  Diagnosis Date   Chronic kidney disease    blood levels slightly elevated, takes HCTZ for this   Hyperlipidemia    Kidney stones    Personal history of colonic adenomas 08/30/2012   Poison ivy    Past Surgical History:  Procedure Laterality Date   CHOLECYSTECTOMY     COLONOSCOPY  12/07/2017   Family History  Problem Relation Age of Onset   Colon cancer Neg Hx    Esophageal cancer Neg Hx    Rectal cancer Neg Hx    Stomach cancer Neg Hx    Colon polyps Neg Hx     Social History   Tobacco Use   Smoking status: Never   Smokeless tobacco: Never  Substance Use Topics   Alcohol use: Yes    Comment: socially   Marital Status: Married  ROS  Review of Systems  Cardiovascular:  Positive for dyspnea on exertion. Negative for chest pain and leg swelling.   Objective  Blood pressure 127/71, pulse (!) 57,  temperature 97.8 F (36.6 C), temperature source Temporal, resp. rate 16, height $RemoveBe'6\' 1"'JsIckvEDZ$  (1.854 m), weight 284 lb 6.4 oz (129 kg), SpO2 97 %. Body mass index is 37.52 kg/m.     03/11/2022    9:06 AM 01/27/2022    9:14 AM 12/29/2020   11:06 AM  Vitals with BMI  Height $Remov'6\' 1"'jPCxIe$  $Remove'6\' 1"'uvrVMEt$    Weight 284 lbs 6 oz 286 lbs   BMI 78.24 23.53   Systolic 614 431 540  Diastolic 71 96 86  Pulse 57 66 56    Physical Exam Constitutional:      Appearance: He is obese.  Neck:     Vascular: No carotid bruit or JVD.  Cardiovascular:     Rate and Rhythm: Normal rate and regular rhythm.     Pulses: Intact distal pulses.     Heart sounds: Normal heart sounds. No murmur heard.    No gallop.  Pulmonary:     Effort: Pulmonary effort is normal.     Breath sounds: Normal breath sounds.  Abdominal:     General: Bowel sounds are normal.     Palpations: Abdomen is soft.  Musculoskeletal:     Right lower leg: No edema.     Left lower leg: No edema.    Medications and allergies   Allergies  Allergen Reactions   Sulfa Antibiotics  Turned red     Medication list after today's encounter   Current Outpatient Medications:    aspirin EC 81 MG tablet, Take 81 mg by mouth daily. , Disp: , Rfl:    atorvastatin (LIPITOR) 20 MG tablet, Take 1 tablet by mouth daily., Disp: , Rfl:    Semaglutide,0.25 or 0.5MG /DOS, 2 MG/3ML SOPN, Inject 0.5 mg into the skin once a week., Disp: 3 mL, Rfl: 1   olmesartan-hydrochlorothiazide (BENICAR HCT) 20-12.5 MG tablet, Take 1 tablet by mouth every morning., Disp: 90 tablet, Rfl: 3  Laboratory examination:   Lab Results  Component Value Date   NA 143 02/17/2022   K 3.8 02/17/2022   CO2 24 02/17/2022   GLUCOSE 133 (H) 02/17/2022   BUN 15 02/17/2022   CREATININE 1.20 02/17/2022   CALCIUM 9.4 02/17/2022   EGFR 63 02/17/2022   GFRNONAA 46 (L) 11/17/2008    External labs:   Labs 10/11/2021:  A1c 6.0%.  Total cholesterol 114, triglycerides 164, HDL 39, LDL 42.  Hb  15.5/HCT 45.5, platelets 223.  BUN 16, creatinine 1.31, EGFR 53 mL, LFTs normal.  Urine analysis is normal.  Radiology:   Coronary calcium score 11/09/2021: LM 0 LAD farthing 59 LCx 118 RCA 875. Total Agatston score 1152. MESA database percentile: 87 Ascending aorta 40 mm, descending aorta 31 mm.  Tortuosity of the descending aorta.  No other significant extracardiac abnormality.  Cardiac Studies:   PCV ECHOCARDIOGRAM COMPLETE 03/03/2022  Narrative Echocardiogram 03/03/2022: Normal LV systolic function with visual EF 60-65%. Left ventricle cavity is normal in size. Normal left ventricular wall thickness. Normal global wall motion. Normal diastolic filling pattern, normal LAP. No significant valvular heart disease. The aortic root is dilated at 70mm and proximal ascending aorat measures 52mm. No prior study for comparison.    PCV MYOCARDIAL PERFUSION WO LEXISCAN 02/21/2022  Narrative Exercise nuclear stress test 02/21/22 Myocardial perfusion is normal. Low risk study. Overall LV systolic function is normal without regional wall motion abnormalities. Stress LV EF: 56%. Normal ECG stress. The patient exercised for 5 minutes and 14 seconds of a Bruce protocol, achieving approximately 7.05 METs and 95% MPHR. The heart rate response was normal. The blood pressure response was normal. No previous exam available for comparison.   EKG:   EKG 01/27/2022: Normal sinus rhythm at rate of 65 bpm, normal axis, poor R wave progression, probably normal variant.  No evidence of ischemia.  PACs (2).  Low-voltage complexes, consider pulmonary disease pattern.    Assessment     ICD-10-CM   1. Coronary artery disease involving native coronary artery of native heart with other form of angina pectoris ([place])  I25.118 Semaglutide,0.25 or 0.5MG /DOS, 2 MG/3ML SOPN    2. Hyperglycemia  R73.9 Semaglutide,0.25 or 0.5MG /DOS, 2 MG/3ML SOPN    3. Primary hypertension  I10 olmesartan-hydrochlorothiazide  (BENICAR HCT) 20-12.5 MG tablet    4. Pure hypercholesterolemia  E78.00     5. Aortic root dilatation (HCC)  I77.810     6. Stage 3a chronic kidney disease (HCC)  N18.31        No orders of the defined types were placed in this encounter.   Meds ordered this encounter  Medications   olmesartan-hydrochlorothiazide (BENICAR HCT) 20-12.5 MG tablet    Sig: Take 1 tablet by mouth every morning.    Dispense:  90 tablet    Refill:  3   Semaglutide,0.25 or 0.5MG /DOS, 2 MG/3ML SOPN    Sig: Inject 0.5 mg into the skin once a  week.    Dispense:  3 mL    Refill:  1      Recommendations:   Wesley Trujillo is a 76 y.o.  Patient with primary hypertension, stage IIIa chronic kidney disease, moderate obesity, hyperglycemia is referred to me for evaluation of coronary artery disease.  Remote carotid duplex in 2016 anterior mild bilateral atherosclerotic changes.  He underwent coronary calcium scoring on 11/10/2021 revealing borderline ascending aortic dilatation, coronary calcium score markedly elevated in the 87 percentile and hence referred to me for further evaluation and risk stratification.  I had seen him 6 weeks ago.    I had changed him to olmesartan HCT in view of uncontrolled hypertension and aortic root dilatation has enough to do for which is tolerating.  Over the past year or 2.  Dyspnea on exertion could be his anginal equivalent.  I reviewed the results of the nuclear stress test, fortunately low risk.  However he has severe coronary calcification suggestive of coronary artery disease, markedly reduced exercise tolerance, prediabetes mellitus and also chronic kidney disease stage IIIa.  All of these makes the patient.  Extremely high risk for future cardiovascular events.  He will also need aortic root dilatation follow-up, as there is a good correlation between his echocardiogram and CT scan, we could do annual echocardiogram.   In view of moderate obesity, prediabetes mellitus,  underlying CAD, best option is to start him on Ozempic.  Clear-cut instructions regarding how to use it and reduction in the fluid volume when he starts the medication to avoid nausea.  Discussed with the patient.  First dose was administered today.  Lipids are well controlled, triglycerides will improve with weight loss as well.  I would like to see him back in 6 weeks for follow-up.    Administration Action Time Recorded Time Documented By Site Comment Reason Patient Supplied  Given : 0.25 mg :   : Subcutaneous 03/11/22 1023 03/11/22 1024 Gaye Alken, CMA Left Lower Abdomen Lot # :[id number]  Exp: 07-28-23  No     Adrian Prows, MD, [facility] 03/11/2022, 10:19 AM Office: [phone]

## 2022-03-16 ENCOUNTER — Other Ambulatory Visit: Payer: Self-pay

## 2022-03-16 DIAGNOSIS — I10 Essential (primary) hypertension: Secondary | ICD-10-CM

## 2022-03-16 MED ORDER — OLMESARTAN MEDOXOMIL-HCTZ 20-12.5 MG PO TABS: 1.0000 | ORAL_TABLET | ORAL | 1 refills | Status: DC

## 2022-04-18 ENCOUNTER — Ambulatory Visit: Payer: Medicare Other | Admitting: Cardiology

## 2022-04-18 ENCOUNTER — Encounter: Payer: Self-pay | Admitting: Cardiology

## 2022-04-18 VITALS — BP 120/90 | HR 67 | Resp 14 | Ht 73.0 in | Wt 270.0 lb

## 2022-04-18 DIAGNOSIS — I25118 Atherosclerotic heart disease of native coronary artery with other forms of angina pectoris: Secondary | ICD-10-CM

## 2022-04-18 DIAGNOSIS — I77810 Thoracic aortic ectasia: Secondary | ICD-10-CM | POA: Diagnosis not present

## 2022-04-18 DIAGNOSIS — R739 Hyperglycemia, unspecified: Secondary | ICD-10-CM | POA: Diagnosis not present

## 2022-04-18 DIAGNOSIS — I10 Essential (primary) hypertension: Secondary | ICD-10-CM | POA: Diagnosis not present

## 2022-04-18 DIAGNOSIS — E6609 Other obesity due to excess calories: Secondary | ICD-10-CM

## 2022-04-18 MED ORDER — SEMAGLUTIDE (1 MG/DOSE) 4 MG/3ML ~~LOC~~ SOPN: 1.0000 mg | PEN_INJECTOR | SUBCUTANEOUS | 1 refills | Status: DC

## 2022-04-18 MED ORDER — CARVEDILOL 6.25 MG PO TABS: 6.2500 mg | ORAL_TABLET | Freq: Two times a day (BID) | ORAL | 2 refills | Status: DC

## 2022-04-18 NOTE — Progress Notes (Signed)
Primary Physician/Referring:  Deland Pretty, MD  Patient ID: Wesley Trujillo, male    DOB: 04-12-1946, 76 y.o.   MRN: [id number]  Chief Complaint  Patient presents with   Coronary Artery Disease   Hypertension   Obesity   Follow-up    6 weeks   HPI:    Wesley Trujillo  is a 76 y.o.  Patient with primary hypertension, stage IIIa chronic kidney disease, moderate obesity, hyperglycemia is referred to me for evaluation of coronary artery disease.  Remote carotid duplex in 2016 anterior mild bilateral atherosclerotic changes.  He underwent coronary calcium scoring on 11/10/2021 revealing borderline ascending aortic dilatation, coronary calcium score markedly elevated in the 87 percentile.  Except for chronic dyspnea, he has no other specific complaints.  Past Medical History:  Diagnosis Date   Chronic kidney disease    blood levels slightly elevated, takes HCTZ for this   Hyperlipidemia    Kidney stones    Personal history of colonic adenomas 08/30/2012   Poison ivy    Past Surgical History:  Procedure Laterality Date   CHOLECYSTECTOMY     COLONOSCOPY  12/07/2017   Family History  Problem Relation Age of Onset   Colon cancer Neg Hx    Esophageal cancer Neg Hx    Rectal cancer Neg Hx    Stomach cancer Neg Hx    Colon polyps Neg Hx     Social History   Tobacco Use   Smoking status: Never   Smokeless tobacco: Never  Substance Use Topics   Alcohol use: Yes    Comment: socially   Marital Status: Married  ROS  Review of Systems  Constitutional: Positive for weight loss.  Cardiovascular:  Positive for dyspnea on exertion. Negative for chest pain and leg swelling.   Objective  Blood pressure (!) 120/90, pulse 67, resp. rate 14, height _0  (1.854 m), weight 270 lb (122.5 kg), SpO2 99 %. Body mass index is 35.62 kg/m.     04/18/2022    9:52 AM 04/18/2022    9:46 AM 03/11/2022    9:06 AM  Vitals with BMI  Height  _1  _2   Weight  270 lbs 284 lbs 6 oz  BMI  15.17 61.60   Systolic 737 106 269  Diastolic 90 485 71  Pulse 67 70 57    Physical Exam Constitutional:      Appearance: He is obese.  Neck:     Vascular: No carotid bruit or JVD.  Cardiovascular:     Rate and Rhythm: Normal rate and regular rhythm.     Pulses: Intact distal pulses.     Heart sounds: Normal heart sounds. No murmur heard.    No gallop.  Pulmonary:     Effort: Pulmonary effort is normal.     Breath sounds: Normal breath sounds.  Abdominal:     General: Bowel sounds are normal.     Palpations: Abdomen is soft.  Musculoskeletal:     Right lower leg: No edema.     Left lower leg: No edema.    Medications and allergies   Allergies  Allergen Reactions   Sulfa Antibiotics     Turned red     Medication list after today's encounter   Current Outpatient Medications:    aspirin EC 81 MG tablet, Take 81 mg by mouth daily. , Disp: , Rfl:    atorvastatin (LIPITOR) 20 MG tablet, Take 1 tablet by mouth daily., Disp: , Rfl:  carvedilol (COREG) 6.25 MG tablet, Take 1 tablet (6.25 mg total) by mouth 2 (two) times daily., Disp: 60 tablet, Rfl: 2   olmesartan-hydrochlorothiazide (BENICAR HCT) 20-12.5 MG tablet, Take 1 tablet by mouth every morning., Disp: 90 tablet, Rfl: 1   Semaglutide, 1 MG/DOSE, 4 MG/3ML SOPN, Inject 1 mg into the skin once a week., Disp: 1 mL, Rfl: 1  Laboratory examination:   Lab Results  Component Value Date   NA 143 02/17/2022   K 3.8 02/17/2022   CO2 24 02/17/2022   GLUCOSE 133 (H) 02/17/2022   BUN 15 02/17/2022   CREATININE 1.20 02/17/2022   CALCIUM 9.4 02/17/2022   EGFR 63 02/17/2022   GFRNONAA 46 (L) 11/17/2008    External labs:   Labs 10/11/2021:  A1c 6.0%.  Total cholesterol 114, triglycerides 164, HDL 39, LDL 42.  Hb 15.5/HCT 45.5, platelets 223.  BUN 16, creatinine 1.31, EGFR 53 mL, LFTs normal.  Urine analysis is normal.  Radiology:   Coronary calcium score 11/09/2021: LM 0 LAD farthing 59 LCx 118 RCA 875. Total Agatston  score 1152. MESA database percentile: 87 Ascending aorta 40 mm, descending aorta 31 mm.  Tortuosity of the descending aorta.  No other significant extracardiac abnormality.  Cardiac Studies:   PCV ECHOCARDIOGRAM COMPLETE 03/03/2022  Narrative Echocardiogram 03/03/2022: Normal LV systolic function with visual EF 60-65%. Left ventricle cavity is normal in size. Normal left ventricular wall thickness. Normal global wall motion. Normal diastolic filling pattern, normal LAP. No significant valvular heart disease. The aortic root is dilated at 77m and proximal ascending aorat measures 464m No prior study for comparison.    PCV MYOCARDIAL PERFUSION WO LEXISCAN 02/21/2022  Narrative Exercise nuclear stress test 02/21/22 Myocardial perfusion is normal. Low risk study. Overall LV systolic function is normal without regional wall motion abnormalities. Stress LV EF: 56%. Normal ECG stress. The patient exercised for 5 minutes and 14 seconds of a Bruce protocol, achieving approximately 7.05 METs and 95% MPHR. The heart rate response was normal. The blood pressure response was normal. No previous exam available for comparison.   EKG:   EKG 01/27/2022: Normal sinus rhythm at rate of 65 bpm, normal axis, poor R wave progression, probably normal variant.  No evidence of ischemia.  PACs (2).  Low-voltage complexes, consider pulmonary disease pattern.    Assessment     ICD-10-CM   1. Coronary artery disease involving native coronary artery of native heart with other form of angina pectoris ([place] I25.118 Semaglutide, 1 MG/DOSE, 4 MG/3ML SOPN    carvedilol (COREG) 6.25 MG tablet    2. Primary hypertension  I10 carvedilol (COREG) 6.25 MG tablet    3. Aortic root dilatation (HCC)  I77.810     4. Hyperglycemia  R73.9     5. Class 2 obesity due to excess calories without serious comorbidity with body mass index (BMI) of 37.0 to 37.9 in adult  E66.09 Semaglutide, 1 MG/DOSE, 4 MG/3ML SOPN   Z68.37         No orders of the defined types were placed in this encounter.   Meds ordered this encounter  Medications   Semaglutide, 1 MG/DOSE, 4 MG/3ML SOPN    Sig: Inject 1 mg into the skin once a week.    Dispense:  1 mL    Refill:  1   carvedilol (COREG) 6.25 MG tablet    Sig: Take 1 tablet (6.25 mg total) by mouth 2 (two) times daily.    Dispense:  60 tablet  Refill:  2      Recommendations:   Wesley Trujillo is a 76 y.o.  Patient with primary hypertension, stage IIIa chronic kidney disease, moderate obesity, hyperglycemia is referred to me for evaluation of coronary artery disease.  Remote carotid duplex in 2016 anterior mild bilateral atherosclerotic changes.  He underwent coronary calcium scoring on 11/10/2021 revealing borderline ascending aortic dilatation, coronary calcium score markedly elevated in the 87 percentile.  Fortunately he has had a low risk nuclear stress test.  He is presently on Ozempic, has lost about 15 pounds in weight, feels well.  He is trying to exercise/walk on a daily basis.  His blood pressure is elevated, I will add carvedilol 6.25 mg twice daily.  Suspect with weight loss, with the addition of olmesartan HCT, blood pressure will improve.  I also suspect his triglycerides will improve.  Positive reinforcement given regarding his weight loss.  I also again discussed with him regarding aortic root dilatation as well.  I would like to see him back in 2 months, if he remains stable, I could make his visits annual visits for aortic root dilatation and CAD management.  He also has hyperglycemia which again I believe with lifestyle changes could improve.   Adrian Prows, MD, [facility] 04/18/2022, 10:23 AM Office: [phone]

## 2022-04-27 ENCOUNTER — Telehealth: Payer: Self-pay

## 2022-04-27 ENCOUNTER — Encounter: Payer: Self-pay | Admitting: Cardiology

## 2022-04-27 DIAGNOSIS — I25118 Atherosclerotic heart disease of native coronary artery with other forms of angina pectoris: Secondary | ICD-10-CM

## 2022-04-27 DIAGNOSIS — I2510 Atherosclerotic heart disease of native coronary artery without angina pectoris: Secondary | ICD-10-CM | POA: Insufficient documentation

## 2022-04-27 DIAGNOSIS — R739 Hyperglycemia, unspecified: Secondary | ICD-10-CM

## 2022-04-27 DIAGNOSIS — E6609 Other obesity due to excess calories: Secondary | ICD-10-CM

## 2022-04-27 NOTE — Telephone Encounter (Signed)
Ozempic was denied

## 2022-04-27 NOTE — Telephone Encounter (Signed)
Either Ozempic or Mancel Parsons is okay as discussed.  We will find more information from the manufacturer/rep

## 2022-04-28 NOTE — Telephone Encounter (Signed)
Can you send for the wegovy?

## 2022-04-29 MED ORDER — WEGOVY 1.7 MG/0.75ML ~~LOC~~ SOAJ: 1.7000 mg | SUBCUTANEOUS | 1 refills | Status: DC

## 2022-04-29 NOTE — Telephone Encounter (Signed)
ICD-10-CM   1. Coronary artery disease involving native coronary artery of native heart with other form of angina pectoris ([place])  I25.118     2. Hyperglycemia  R73.9 Semaglutide-Weight Management (WEGOVY) 1.7 MG/0.75ML SOAJ    3. Class 2 obesity due to excess calories without serious comorbidity with body mass index (BMI) of 37.0 to 37.9 in adult  E66.09 Semaglutide-Weight Management (WEGOVY) 1.7 MG/0.75ML SOAJ   Z68.37      No orders of the defined types were placed in this encounter.   Meds ordered this encounter  Medications   Semaglutide-Weight Management (WEGOVY) 1.7 MG/0.75ML SOAJ    Sig: Inject 1.7 mg into the skin every 7 (seven) days.    Dispense:  3 mL    Refill:  1

## 2022-05-16 DIAGNOSIS — Z23 Encounter for immunization: Secondary | ICD-10-CM | POA: Diagnosis not present

## 2022-05-24 DIAGNOSIS — H35363 Drusen (degenerative) of macula, bilateral: Secondary | ICD-10-CM | POA: Diagnosis not present

## 2022-05-24 DIAGNOSIS — H3530 Unspecified macular degeneration: Secondary | ICD-10-CM | POA: Diagnosis not present

## 2022-06-21 ENCOUNTER — Ambulatory Visit: Payer: Medicare Other | Admitting: Cardiology

## 2022-07-12 ENCOUNTER — Encounter: Payer: Self-pay | Admitting: Cardiology

## 2022-07-12 ENCOUNTER — Ambulatory Visit: Payer: Medicare Other | Admitting: Cardiology

## 2022-07-12 VITALS — BP 121/82 | HR 62 | Ht 73.0 in | Wt 269.4 lb

## 2022-07-12 DIAGNOSIS — Z6837 Body mass index (BMI) 37.0-37.9, adult: Secondary | ICD-10-CM

## 2022-07-12 DIAGNOSIS — I25118 Atherosclerotic heart disease of native coronary artery with other forms of angina pectoris: Secondary | ICD-10-CM | POA: Diagnosis not present

## 2022-07-12 DIAGNOSIS — R739 Hyperglycemia, unspecified: Secondary | ICD-10-CM

## 2022-07-12 DIAGNOSIS — I77810 Thoracic aortic ectasia: Secondary | ICD-10-CM | POA: Diagnosis not present

## 2022-07-12 DIAGNOSIS — E6609 Other obesity due to excess calories: Secondary | ICD-10-CM | POA: Diagnosis not present

## 2022-07-12 NOTE — Progress Notes (Signed)
Primary Physician/Referring:  Deland Pretty, MD  Patient ID: Wesley Trujillo, male    DOB: 05/19/1946, 77 y.o.   MRN: [id number]  Chief Complaint  Patient presents with   Coronary artery disease involving native coronary artery of   Hyperlipidemia   Hypertension   Follow-up   HPI:    Wesley Trujillo  is a 77 y.o.  Patient with primary hypertension, stage IIIa chronic kidney disease, moderate obesity, hyperglycemia, CAD with markedly elevated coronary calcium score >1000 in the 87th percentile in May 2023, mild coronary artery atherosclerosis by duplex in 2016, ascending aortic dilatation presents here for a 13-monthfollow-up visit for management of obesity, hypertension and coronary atherosclerosis.  He has had a low risk cardiac stress test in August 2023.  Except for chronic dyspnea, he has no other specific complaints.  I will start him on GLP-1 agonist, however although successful in weight loss, was not approved by his insurance.  He has no specific complaints, states that he is doing his best to continue to lose weight as he was successful with weight loss initially when I started him on Ozempic.  Past Medical History:  Diagnosis Date   Chronic kidney disease    blood levels slightly elevated, takes HCTZ for this   Hyperlipidemia    Kidney stones    Personal history of colonic adenomas 08/30/2012   Poison ivy    Past Surgical History:  Procedure Laterality Date   CHOLECYSTECTOMY     COLONOSCOPY  12/07/2017   Family History  Problem Relation Age of Onset   Colon cancer Neg Hx    Esophageal cancer Neg Hx    Rectal cancer Neg Hx    Stomach cancer Neg Hx    Colon polyps Neg Hx     Social History   Tobacco Use   Smoking status: Never   Smokeless tobacco: Never  Substance Use Topics   Alcohol use: Yes    Comment: socially   Marital Status: Married  ROS  Review of Systems  Constitutional: Positive for weight loss.  Cardiovascular:  Positive for dyspnea on exertion.  Negative for chest pain and leg swelling.   Objective  Blood pressure 121/82, pulse 62, height '6\' 1"'$  (1.854 m), weight 269 lb 6.4 oz (122.2 kg), SpO2 98 %. Body mass index is 35.54 kg/m.     07/12/2022   10:24 AM 04/18/2022    9:52 AM 04/18/2022    9:46 AM  Vitals with BMI  Height '6\' 1"'$   '6\' 1"'$   Weight 269 lbs 6 oz  270 lbs  BMI 374.16 338.45 Systolic [id number] Diastolic 82 90 1224 Pulse 62 67 70    Physical Exam Constitutional:      Appearance: He is obese.  Neck:     Vascular: No carotid bruit or JVD.  Cardiovascular:     Rate and Rhythm: Normal rate and regular rhythm.     Pulses: Intact distal pulses.     Heart sounds: Normal heart sounds. No murmur heard.    No gallop.  Pulmonary:     Effort: Pulmonary effort is normal.     Breath sounds: Normal breath sounds.  Abdominal:     General: Bowel sounds are normal.     Palpations: Abdomen is soft.  Musculoskeletal:     Right lower leg: No edema.     Left lower leg: No edema.    Medications and allergies   Allergies  Allergen Reactions  Sulfa Antibiotics     Turned red     Medication list after today's encounter   Current Outpatient Medications:    aspirin EC 81 MG tablet, Take 81 mg by mouth daily. , Disp: , Rfl:    atorvastatin (LIPITOR) 20 MG tablet, Take 1 tablet by mouth daily., Disp: , Rfl:    carvedilol (COREG) 6.25 MG tablet, Take 1 tablet (6.25 mg total) by mouth 2 (two) times daily., Disp: 60 tablet, Rfl: 2   olmesartan-hydrochlorothiazide (BENICAR HCT) 20-12.5 MG tablet, Take 1 tablet by mouth every morning., Disp: 90 tablet, Rfl: 1  Laboratory examination:   Lab Results  Component Value Date   NA 143 02/17/2022   K 3.8 02/17/2022   CO2 24 02/17/2022   GLUCOSE 133 (H) 02/17/2022   BUN 15 02/17/2022   CREATININE 1.20 02/17/2022   CALCIUM 9.4 02/17/2022   EGFR 63 02/17/2022   GFRNONAA 46 (L) 11/17/2008    External labs:   Labs 10/11/2021:  A1c 6.0%.  Total cholesterol 114,  triglycerides 164, HDL 39, LDL 42.  Hb 15.5/HCT 45.5, platelets 223.  BUN 16, creatinine 1.31, EGFR 53 mL, LFTs normal.  Urine analysis is normal.  Radiology:   Coronary calcium score 11/09/2021: LM 0 LAD  59 LCx 118 RCA 875. Total Agatston score 1152. MESA database percentile: 87 Ascending aorta 40 mm, descending aorta 31 mm.  Tortuosity of the descending aorta.  No other significant extracardiac abnormality.  Cardiac Studies:   PCV ECHOCARDIOGRAM COMPLETE 03/03/2022  Narrative Echocardiogram 03/03/2022: Normal LV systolic function with visual EF 60-65%. Left ventricle cavity is normal in size. Normal left ventricular wall thickness. Normal global wall motion. Normal diastolic filling pattern, normal LAP. No significant valvular heart disease. The aortic root is dilated at 58m and proximal ascending aorat measures 487m No prior study for comparison.    PCV MYOCARDIAL PERFUSION WO LEXISCAN 02/21/2022  Narrative Exercise nuclear stress test 02/21/22 Myocardial perfusion is normal. Low risk study. Overall LV systolic function is normal without regional wall motion abnormalities. Stress LV EF: 56%. Normal ECG stress. The patient exercised for 5 minutes and 14 seconds of a Bruce protocol, achieving approximately 7.05 METs and 95% MPHR. The heart rate response was normal. The blood pressure response was normal. No previous exam available for comparison.   EKG:   EKG 07/12/2021: Normal sinus rhythm at a rate of 60 bpm.  Normal EKG.  Compared to 01/27/2022, no significant change.   Assessment     ICD-10-CM   1. Coronary artery disease involving native coronary artery of native heart with other form of angina pectoris (HCTerrell I25.118 EKG 12-Lead    2. Hyperglycemia  R73.9     3. Class 2 obesity due to excess calories without serious comorbidity with body mass index (BMI) of 37.0 to 37.9 in adult  E66.09    Z68.37     4. Aortic root dilatation (HCC)  I77.810 PCV ECHOCARDIOGRAM  COMPLETE       Orders Placed This Encounter  Procedures   EKG 12-Lead   PCV ECHOCARDIOGRAM COMPLETE    Standing Status:   Future    Standing Expiration Date:   07/13/2023   Medications Discontinued During This Encounter  Medication Reason   Semaglutide-Weight Management (WEGOVY) 1.7 MG/0.75ML SOAJ Cost of medication   Semaglutide-Weight Management (WEGOVY) 1.7 MG/0.75ML SOAJ Cost of medication    No orders of the defined types were placed in this encounter.  Recommendations:   GaGREYSEN DEVINOs a 77  y.o.  Patient with primary hypertension, stage IIIa chronic kidney disease, moderate obesity, hyperglycemia, CAD with markedly elevated coronary calcium score >1000 in the 87th percentile in May 2023, mild coronary artery atherosclerosis by duplex in 2016, ascending aortic dilatation presents here for a 34-monthfollow-up visit for management of obesity, hypertension and coronary atherosclerosis.  1. Coronary artery disease involving native coronary artery of native heart with other form of angina pectoris ([facility] Patient without recurrence of angina pectoris, he is on aggressive medical therapy, lipids are at goal, patient is in close to 90% with regard to coronary calcium score.  He has not made up his mind to make lifestyle changes and has been careful with his calorie intake.  2. Hyperglycemia I had started him on Ozempic and also Wegovy for hyperglycemia and weight loss, unfortunately although it did help with weight loss, insurance would not approve of the medication hence had to discontinue this.  3. Class 2 obesity due to excess calories without serious comorbidity with body mass index (BMI) of 37.0 to 37.9 in adult Patient was tried to continue to make lifestyle changes, he and his wife are both working with weight management.  4. Aortic root dilatation (HCC) Stable from cardiovascular standpoint, he needs repeat echocardiogram to follow-up on aortic root dilatation in a year and I will  see him back at that time.    JAdrian Prows MD, FA[facility]1/16/2024, 10:55 AM Office: [phone]

## 2022-07-13 ENCOUNTER — Ambulatory Visit: Payer: Medicare Other | Admitting: Cardiology

## 2022-07-15 ENCOUNTER — Other Ambulatory Visit: Payer: Self-pay | Admitting: Cardiology

## 2022-07-15 DIAGNOSIS — I25118 Atherosclerotic heart disease of native coronary artery with other forms of angina pectoris: Secondary | ICD-10-CM

## 2022-07-15 DIAGNOSIS — I10 Essential (primary) hypertension: Secondary | ICD-10-CM

## 2022-08-27 IMAGING — CT CT CARDIAC CORONARY ARTERY CALCIUM SCORE
3 series · 14 of 20 positions shown, 16 images · non-contrast
Comparison: None Available.

CLINICAL DATA: Hyperlipidemia.

EXAM:
CT CARDIAC CORONARY ARTERY CALCIUM SCORE
TECHNIQUE: Non-contrast imaging through the heart was performed using
prospective ECG gating. Image post processing was performed on an
independent workstation, allowing for quantitative analysis of the
heart and coronary arteries. Note that this exam targets the heart
and the chest was not imaged in its entirety.

[Series 2: calcium scoring 2.00 qr36 bestdiast 70% hrt calciu · axial · 0.52mm/px · z∈[+1634,+1718]mm · 4 of 70 slices shown]
[im 14/70  vessel]
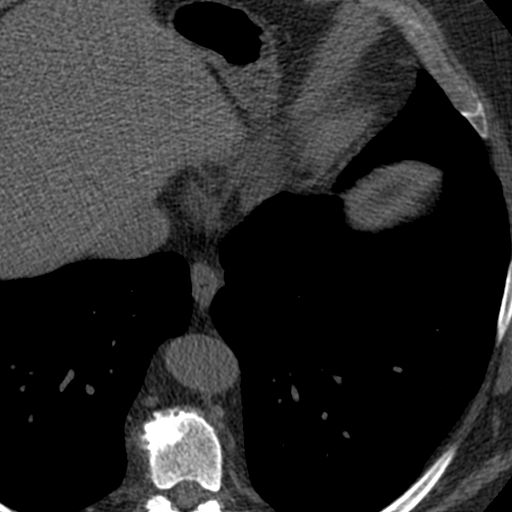
[im 28/70  vessel]
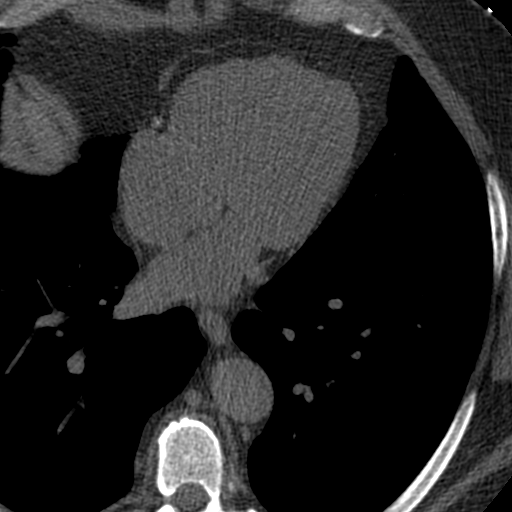
[im 42/70  vessel]
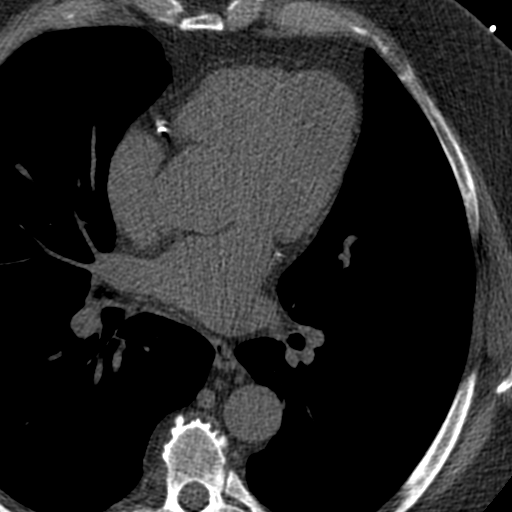
[im 56/70  vessel]
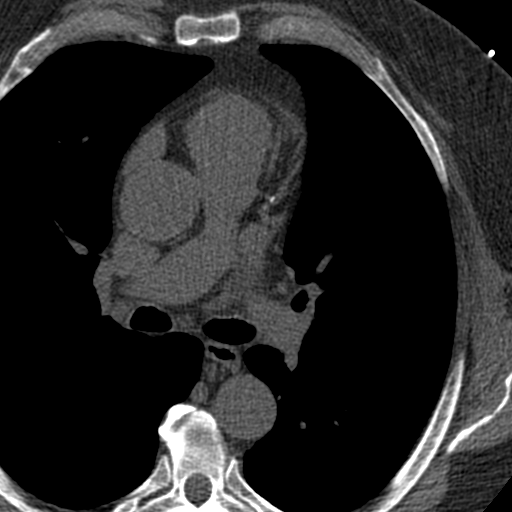

[Series 3: calcium scoring 2.00 br40 bestdiast 70% axial · axial · 0.72mm/px · z∈[+1630,+1722]mm · 5 of 70 slices shown, 7 images]
[im 12/70  vessel]
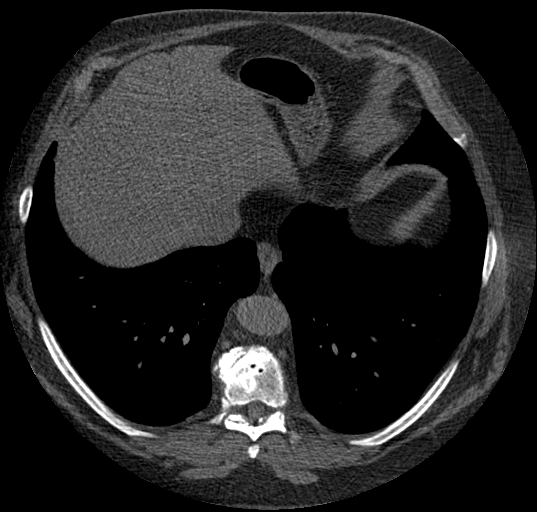
[im 12/70  lung]
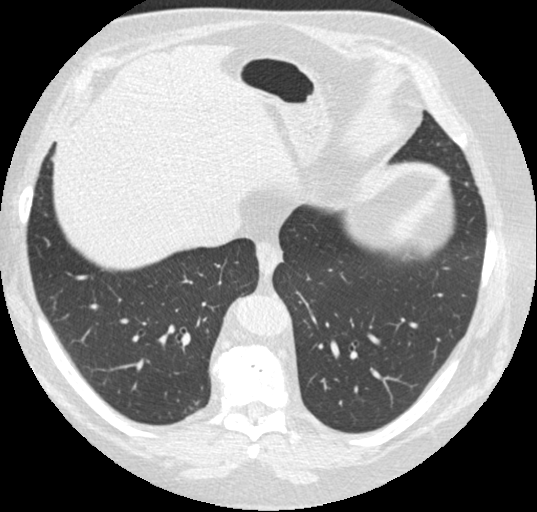
[im 24/70  vessel]
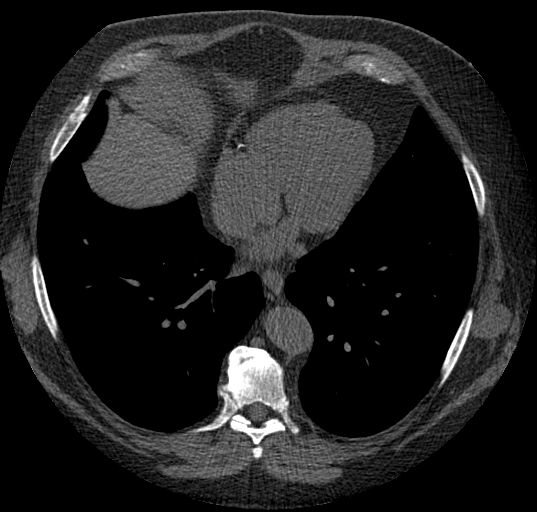
[im 35/70  vessel]
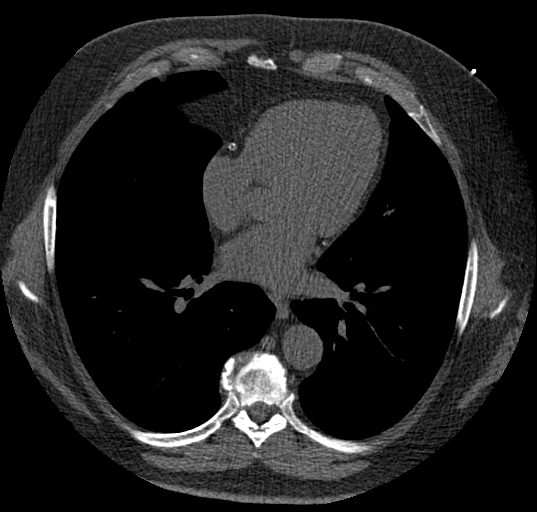
[im 47/70  vessel]
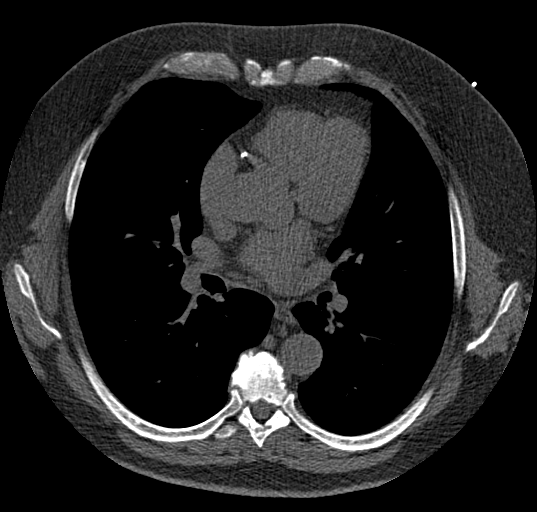
[im 58/70  vessel]
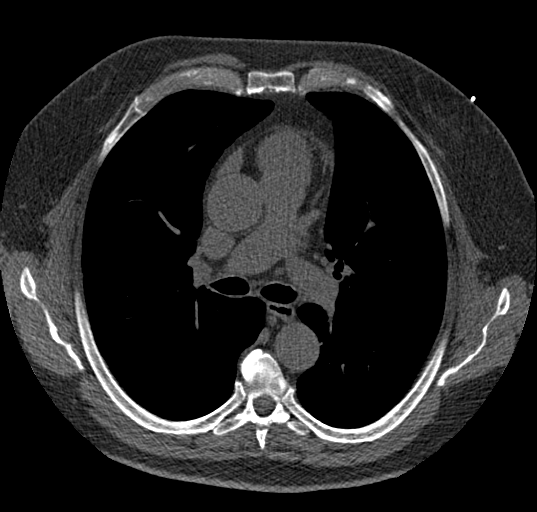
[im 58/70  lung]
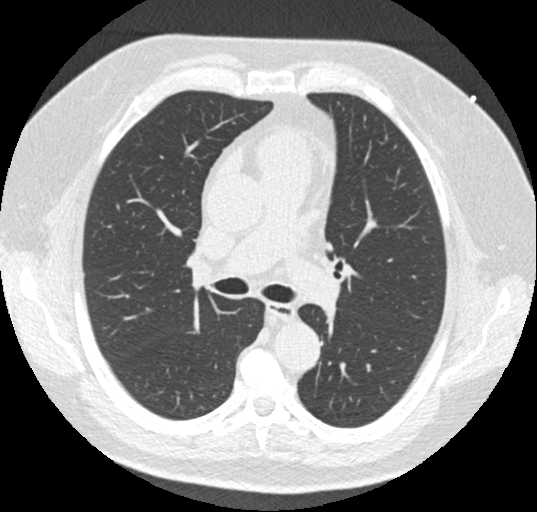

[Series 9: calcium scoring 2.00 br60 bestdiast 70% lungs · axial · 0.73mm/px · z∈[+1630,+1722]mm · 5 of 70 slices shown]
[im 12/70  vessel]
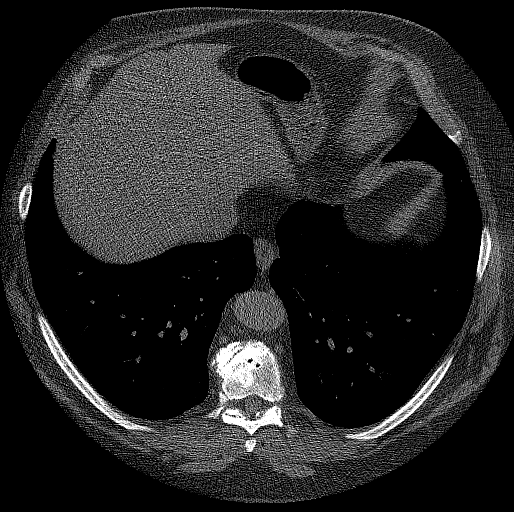
[im 24/70  vessel]
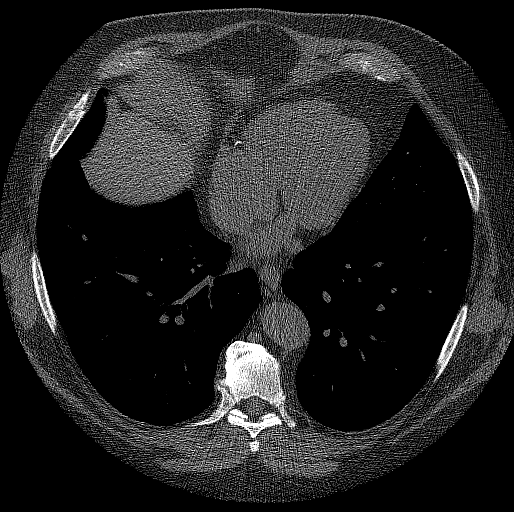
[im 35/70  vessel]
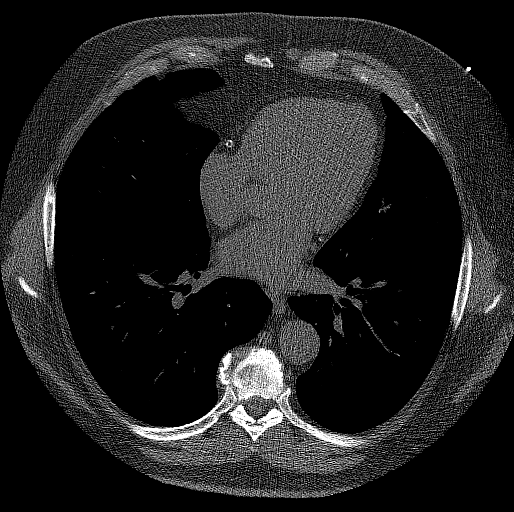
[im 47/70  vessel]
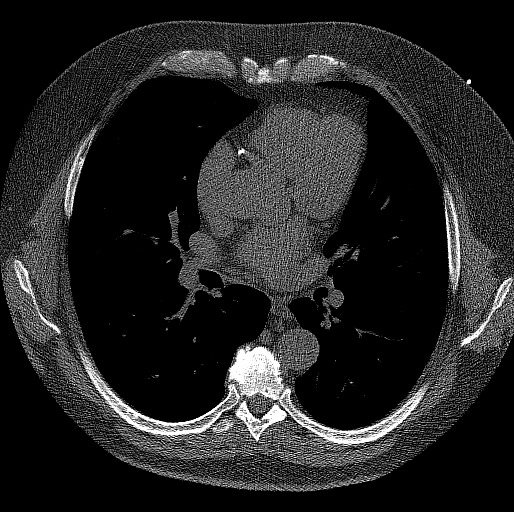
[im 58/70  vessel]
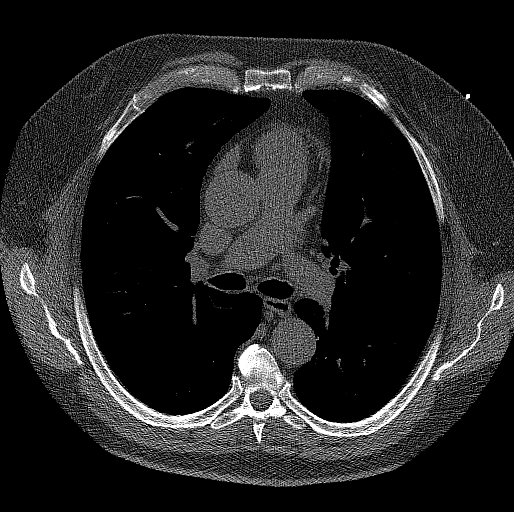

[14 of 20 positions shown; findings below may reference images not displayed]

FINDINGS: CORONARY CALCIUM SCORES:

Left Main: 0

LAD: 559

LCx: 118

RCA: 875

Total Agatston Score: 1,552

[HOSPITAL] percentile: 87

AORTA MEASUREMENTS:

Ascending Aorta: 40 mm

Descending Aorta: 31 mm

OTHER FINDINGS:

Cardiovascular: Borderline ascending aortic dilatation, 4.0 cm.
Tortuous descending thoracic aorta. Normal heart size, without
pericardial effusion.

Mediastinum/Nodes: No imaged thoracic adenopathy.

Lungs/Pleura: No pleural fluid.  Clear imaged lungs.

Upper Abdomen: Normal imaged portions of the liver, spleen, stomach.

Musculoskeletal: Lower thoracic spondylosis.
IMPRESSION: 1. Total Agatston score of [DATE], corresponding to 87th percentile
for age, sex, and race based cohort.
2. Borderline ascending aortic dilatation, 4.0 cm. Recommend annual
imaging followup by CTA or MRA. This recommendation follows 3434
ACCF/AHA/AATS/ACR/ASA/SCA/LYDONLEE/MANALO/NAHAR/SENAIT Guidelines for the
Diagnosis and Management of Patients with Thoracic Aortic Disease.
Circulation. 3434; 121: E266-e369. Aortic aneurysm NOS (8CPD2-BX8.8)

## 2022-09-01 DIAGNOSIS — K080 Exfoliation of teeth due to systemic causes: Secondary | ICD-10-CM | POA: Diagnosis not present

## 2022-10-13 DIAGNOSIS — I10 Essential (primary) hypertension: Secondary | ICD-10-CM | POA: Diagnosis not present

## 2022-10-13 DIAGNOSIS — R739 Hyperglycemia, unspecified: Secondary | ICD-10-CM | POA: Diagnosis not present

## 2022-10-18 DIAGNOSIS — Z0000 Encounter for general adult medical examination without abnormal findings: Secondary | ICD-10-CM | POA: Diagnosis not present

## 2022-10-18 DIAGNOSIS — R7303 Prediabetes: Secondary | ICD-10-CM | POA: Diagnosis not present

## 2022-10-18 DIAGNOSIS — I10 Essential (primary) hypertension: Secondary | ICD-10-CM | POA: Diagnosis not present

## 2022-10-18 DIAGNOSIS — I6523 Occlusion and stenosis of bilateral carotid arteries: Secondary | ICD-10-CM | POA: Diagnosis not present

## 2022-10-18 DIAGNOSIS — Z86010 Personal history of colonic polyps: Secondary | ICD-10-CM | POA: Diagnosis not present

## 2022-11-17 DIAGNOSIS — H35363 Drusen (degenerative) of macula, bilateral: Secondary | ICD-10-CM | POA: Diagnosis not present

## 2023-03-03 DIAGNOSIS — Z23 Encounter for immunization: Secondary | ICD-10-CM | POA: Diagnosis not present

## 2023-03-05 ENCOUNTER — Other Ambulatory Visit: Payer: Self-pay | Admitting: Cardiology

## 2023-03-05 DIAGNOSIS — I10 Essential (primary) hypertension: Secondary | ICD-10-CM

## 2023-03-15 DIAGNOSIS — K080 Exfoliation of teeth due to systemic causes: Secondary | ICD-10-CM | POA: Diagnosis not present

## 2023-04-12 DIAGNOSIS — M25561 Pain in right knee: Secondary | ICD-10-CM | POA: Diagnosis not present

## 2023-04-17 ENCOUNTER — Telehealth: Payer: Self-pay | Admitting: *Deleted

## 2023-04-17 NOTE — Telephone Encounter (Signed)
Pt has been scheduled tele pre op appt 05/19/23. Med rec and consent are done.

## 2023-04-17 NOTE — Telephone Encounter (Signed)
   Name: Wesley Trujillo  DOB: 1946/05/17  MRN: [id number]  Primary Cardiologist: None   Preoperative team, please contact this patient and set up a phone call appointment for further preoperative risk assessment. Please obtain consent and complete medication review. Thank you for your help.  I confirm that guidance regarding antiplatelet and oral anticoagulation therapy has been completed and, if necessary, noted below.  Patient's aspirin is not prescribed by cardiology.  Recommendations for holding aspirin will need to come from prescribing provider.  I also confirmed the patient resides in the state of [place]. As per [facility] Medical Board telemedicine laws, the patient must reside in the state in which the provider is licensed.   Ronney Asters, NP 04/17/2023, 12:41 PM [facility]

## 2023-04-17 NOTE — Telephone Encounter (Signed)
Pt has been scheduled tele pre op appt 05/19/23. Med rec and consent are done.     Patient Consent for Virtual Visit        [name] has provided verbal consent on 04/17/2023 for a virtual visit (video or telephone).   CONSENT FOR VIRTUAL VISIT FOR:  Wesley Trujillo  By participating in this virtual visit I agree to the following:  I hereby voluntarily request, consent and authorize [facility] and its employed or contracted physicians, physician assistants, nurse practitioners or other licensed health care professionals (the Practitioner), to provide me with telemedicine health care services (the "Services") as deemed necessary by the treating Practitioner. I acknowledge and consent to receive the Services by the Practitioner via telemedicine. I understand that the telemedicine visit will involve communicating with the Practitioner through live audiovisual communication technology and the disclosure of certain medical information by electronic transmission. I acknowledge that I have been given the opportunity to request an in-person assessment or other available alternative prior to the telemedicine visit and am voluntarily participating in the telemedicine visit.  I understand that I have the right to withhold or withdraw my consent to the use of telemedicine in the course of my care at any time, without affecting my right to future care or treatment, and that the Practitioner or I may terminate the telemedicine visit at any time. I understand that I have the right to inspect all information obtained and/or recorded in the course of the telemedicine visit and may receive copies of available information for a reasonable fee.  I understand that some of the potential risks of receiving the Services via telemedicine include:  Delay or interruption in medical evaluation due to technological equipment failure or disruption; Information transmitted may not be sufficient (e.g. poor resolution  of images) to allow for appropriate medical decision making by the Practitioner; and/or  In rare instances, security protocols could fail, causing a breach of personal health information.  Furthermore, I acknowledge that it is my responsibility to provide information about my medical history, conditions and care that is complete and accurate to the best of my ability. I acknowledge that Practitioner's advice, recommendations, and/or decision may be based on factors not within their control, such as incomplete or inaccurate data provided by me or distortions of diagnostic images or specimens that may result from electronic transmissions. I understand that the practice of medicine is not an exact science and that Practitioner makes no warranties or guarantees regarding treatment outcomes. I acknowledge that a copy of this consent can be made available to me via my patient portal [facility] MyChart), or I can request a printed copy by calling the office of [facility].    I understand that my insurance will be billed for this visit.   I have read or had this consent read to me. I understand the contents of this consent, which adequately explains the benefits and risks of the Services being provided via telemedicine.  I have been provided ample opportunity to ask questions regarding this consent and the Services and have had my questions answered to my satisfaction. I give my informed consent for the services to be provided through the use of telemedicine in my medical care

## 2023-04-17 NOTE — Telephone Encounter (Signed)
   Pre-operative Risk Assessment    Patient Name: Wesley Trujillo  DOB: [birth year] MRN: [id number]  DATE OF LAST VISIT: 07/12/22 DR. Jacinto Halim DATE OF NEXT VISIT: 08/08/23 [facility]. [facility]    Request for Surgical Clearance    Procedure:   RIGHT TOTAL KNEE ARTHROPLASTY  Date of Surgery:  Clearance 06/15/23                                 Surgeon:  DR.  MATTHEW OLIN Surgeon's Group or Practice Name: [facility] Phone number:  [phone] ATTN: Rosalva Ferron  Fax number:  [phone]   Type of Clearance Requested:   - Medical ; ASA    Type of Anesthesia:  Spinal   Additional requests/questions:    Elpidio Anis   04/17/2023, 12:23 PM

## 2023-04-26 ENCOUNTER — Other Ambulatory Visit: Payer: Self-pay | Admitting: Cardiology

## 2023-04-26 DIAGNOSIS — I10 Essential (primary) hypertension: Secondary | ICD-10-CM

## 2023-04-26 DIAGNOSIS — I25118 Atherosclerotic heart disease of native coronary artery with other forms of angina pectoris: Secondary | ICD-10-CM

## 2023-05-09 DIAGNOSIS — I2510 Atherosclerotic heart disease of native coronary artery without angina pectoris: Secondary | ICD-10-CM | POA: Diagnosis not present

## 2023-05-09 DIAGNOSIS — Z01818 Encounter for other preprocedural examination: Secondary | ICD-10-CM | POA: Diagnosis not present

## 2023-05-09 DIAGNOSIS — I10 Essential (primary) hypertension: Secondary | ICD-10-CM | POA: Diagnosis not present

## 2023-05-09 DIAGNOSIS — R7303 Prediabetes: Secondary | ICD-10-CM | POA: Diagnosis not present

## 2023-05-19 ENCOUNTER — Ambulatory Visit: Payer: Medicare Other

## 2023-05-19 NOTE — Telephone Encounter (Signed)
Patients tele visit rescheduled to 05/31/23 due to preop provider being out of office.

## 2023-05-22 DIAGNOSIS — H35363 Drusen (degenerative) of macula, bilateral: Secondary | ICD-10-CM | POA: Diagnosis not present

## 2023-05-23 DIAGNOSIS — M25661 Stiffness of right knee, not elsewhere classified: Secondary | ICD-10-CM | POA: Diagnosis not present

## 2023-05-23 DIAGNOSIS — M25561 Pain in right knee: Secondary | ICD-10-CM | POA: Diagnosis not present

## 2023-05-23 DIAGNOSIS — M1711 Unilateral primary osteoarthritis, right knee: Secondary | ICD-10-CM | POA: Diagnosis not present

## 2023-05-31 ENCOUNTER — Ambulatory Visit: Payer: Medicare Other | Attending: Cardiovascular Disease

## 2023-05-31 DIAGNOSIS — Z01810 Encounter for preprocedural cardiovascular examination: Secondary | ICD-10-CM

## 2023-05-31 NOTE — Progress Notes (Signed)
Virtual Visit via Telephone Note   Because of Wesley Trujillo co-morbid illnesses, he is at least at moderate risk for complications without adequate follow up.  This format is felt to be most appropriate for this patient at this time.  The patient did not have access to video technology/had technical difficulties with video requiring transitioning to audio format only (telephone).  All issues noted in this document were discussed and addressed.  No physical exam could be performed with this format.  Please refer to the patient's chart for his consent to telehealth for [facility].  Evaluation Performed:  Preoperative cardiovascular risk assessment _____________   Date:  05/31/2023   Patient ID:  Wesley Trujillo, DOB September 10, 1945, MRN [id number] Patient Location:  Home Provider location:   Office  Primary Care Provider:  Merri Brunette, MD Primary Cardiologist:  None  Chief Complaint / Patient Profile   77 y.o. y/o male with a h/o coronary artery disease, hypertension, hyperlipidemia who is pending right total knee arthroplasty and presents today for telephonic preoperative cardiovascular risk assessment.  History of Present Illness    Wesley Trujillo is a 77 y.o. male who presents via audio/video conferencing for a telehealth visit today.  Pt was last seen in cardiology clinic on 07/02/2022 by Dr. Jacinto Halim.  At that time JAKYLAN RHODD was doing well .  The patient is now pending procedure as outlined above. Since his last visit, he remains stable from a cardiac standpoint.  Today he denies chest pain, shortness of breath, lower extremity edema, fatigue, palpitations, melena, hematuria, hemoptysis, diaphoresis, weakness, presyncope, syncope, orthopnea, and PND.   Past Medical History    Past Medical History:  Diagnosis Date   Chronic kidney disease    blood levels slightly elevated, takes HCTZ for this   Hyperlipidemia    Kidney stones    Personal history of colonic adenomas 08/30/2012    Poison ivy    Past Surgical History:  Procedure Laterality Date   CHOLECYSTECTOMY     COLONOSCOPY  12/07/2017    Allergies  Allergies  Allergen Reactions   Sulfa Antibiotics     Turned red    Home Medications    Prior to Admission medications   Medication Sig Start Date End Date Taking? Authorizing Provider  aspirin EC 81 MG tablet Take 81 mg by mouth daily.     [provider]  atorvastatin (LIPITOR) 20 MG tablet Take 1 tablet by mouth daily. 10/05/20   [provider]  carvedilol (COREG) 6.25 MG tablet TAKE 1 TABLET(6.25 MG) BY MOUTH TWICE DAILY 04/27/23   Yates Decamp, MD  olmesartan-hydrochlorothiazide (BENICAR HCT) 20-12.5 MG tablet TAKE 1 TABLET BY MOUTH EVERY MORNING 03/06/23   Yates Decamp, MD    Physical Exam    Vital Signs:  Ollen Barges does not have vital signs available for review today.  Given telephonic nature of communication, physical exam is limited. AAOx3. NAD. Normal affect.  Speech and respirations are unlabored.  Accessory Clinical Findings    None  Assessment & Plan    1.  Preoperative Cardiovascular Risk Assessment: RIGHT TOTAL KNEE ARTHROPLASTY   Date of Surgery:  Clearance 06/15/23                                 Surgeon:  DR.  MATTHEW OLIN Surgeon's Group or Practice Name: [facility] Phone number:  [phone] ATTN: Rosalva Ferron  Fax  number:  [phone]     Primary Cardiologist: Dr. Jacinto Halim  Chart reviewed as part of pre-operative protocol coverage. Given past medical history and time since last visit, based on ACC/AHA guidelines, Wesley Trujillo would be at acceptable risk for the planned procedure without further cardiovascular testing.   Patient was advised that if he develops new symptoms prior to surgery to contact our office to arrange a follow-up appointment.  He verbalized understanding.  His RCRI is low risk, 0.9% risk of major cardiac event.  He is able to complete greater than 4 METS of physical  activity.  Patient's aspirin is not prescribed by cardiology. Recommendations for holding aspirin will need to come from prescribing provider.   I will route this recommendation to the requesting party via Epic fax function and remove from pre-op pool.  Please call with questions.       Time:   Today, I have spent 5  minutes with the patient with telehealth technology discussing medical history, symptoms, and management plan.     Ronney Asters, NP  05/31/2023, 8:04 AM     Prior to patient's phone evaluation I spent greater than 10 minutes reviewing their past medical history and cardiac medications.

## 2023-06-06 DIAGNOSIS — K080 Exfoliation of teeth due to systemic causes: Secondary | ICD-10-CM | POA: Diagnosis not present

## 2023-06-06 NOTE — Patient Instructions (Signed)
DUE TO COVID-19 ONLY TWO VISITORS  (aged 76 and older)  ARE ALLOWED TO COME WITH YOU AND STAY IN THE WAITING ROOM ONLY DURING PRE OP AND PROCEDURE.   **NO VISITORS ARE ALLOWED IN THE SHORT STAY AREA OR RECOVERY ROOM!!**  IF YOU WILL BE ADMITTED INTO THE HOSPITAL YOU ARE ALLOWED ONLY FOUR SUPPORT PEOPLE DURING VISITATION HOURS ONLY (7 AM -8PM)   The support person(s) must pass our screening, gel in and out, and wear a mask at all times, including in the patient's room. Patients must also wear a mask when staff or their support person are in the room. Visitors GUEST BADGE MUST BE WORN VISIBLY  One adult visitor may remain with you overnight and MUST be in the room by 8 P.M.     Your procedure is scheduled on: 06/15/23   Report to [facility] Main Entrance    Report to admitting at : 8:30 AM   Call this number if you have problems the morning of surgery [phone]   Do not eat food :After Midnight.   After Midnight you may have the following liquids until : 8:00 AM DAY OF SURGERY  Water Black Coffee (sugar ok, NO MILK/CREAM OR CREAMERS)  Tea (sugar ok, NO MILK/CREAM OR CREAMERS) regular and decaf                             Plain Jell-O (NO RED)                                           Fruit ices (not with fruit pulp, NO RED)                                     Popsicles (NO RED)                                                                  Juice: apple, WHITE grape, WHITE cranberry Sports drinks like Gatorade (NO RED)    The day of surgery:  Drink ONE (1) Pre-Surgery Clear Ensure at : 8:00 AM the morning of surgery. Drink in one sitting. Do not sip.  This drink was given to you during your hospital  pre-op appointment visit. Nothing else to drink after completing the  Pre-Surgery Clear Ensure or G2.          If you have questions, please contact your surgeon's office.  FOLLOW ANY ADDITIONAL PRE OP INSTRUCTIONS YOU RECEIVED FROM YOUR SURGEON'S OFFICE!!!   Oral  Hygiene is also important to reduce your risk of infection.                                    Remember - BRUSH YOUR TEETH THE MORNING OF SURGERY WITH YOUR REGULAR TOOTHPASTE  DENTURES WILL BE REMOVED PRIOR TO SURGERY PLEASE DO NOT APPLY "Poly grip" OR ADHESIVES!!!   Do NOT smoke after Midnight   Take these medicines the morning of  surgery with A SIP OF WATER: carvedilol.  Bring CPAP mask and tubing day of surgery.                              You may not have any metal on your body including hair pins, jewelry, and body piercing             Do not wear lotions, powders, perfumes/cologne, or deodorant              Men may shave face and neck.   Do not bring valuables to the hospital. [facility] IS NOT             RESPONSIBLE   FOR VALUABLES.   Contacts, glasses, or bridgework may not be worn into surgery.   Bring small overnight bag day of surgery.   DO NOT BRING YOUR HOME MEDICATIONS TO THE HOSPITAL. PHARMACY WILL DISPENSE MEDICATIONS LISTED ON YOUR MEDICATION LIST TO YOU DURING YOUR ADMISSION IN THE HOSPITAL!    Patients discharged on the day of surgery will not be allowed to drive home.  Someone NEEDS to stay with you for the first 24 hours after anesthesia.   Special Instructions: Bring a copy of your healthcare power of attorney and living will documents         the day of surgery if you haven't scanned them before.              Please read over the following fact sheets you were given: IF YOU HAVE QUESTIONS ABOUT YOUR PRE-OP INSTRUCTIONS PLEASE CALL [phone]      Pre-operative 5 CHG Bath Instructions   You can play a key role in reducing the risk of infection after surgery. Your skin needs to be as free of germs as possible. You can reduce the number of germs on your skin by washing with CHG (chlorhexidine gluconate) soap before surgery. CHG is an antiseptic soap that kills germs and continues to kill germs even after washing.   DO NOT use if you have an allergy to  chlorhexidine/CHG or antibacterial soaps. If your skin becomes reddened or irritated, stop using the CHG and notify one of our RNs at : [phone].   Please shower with the CHG soap starting 4 days before surgery using the following schedule:     Please keep in mind the following:  DO NOT shave, including legs and underarms, starting the day of your first shower.   You may shave your face at any point before/day of surgery.  Place clean sheets on your bed the day you start using CHG soap. Use a clean washcloth (not used since being washed) for each shower. DO NOT sleep with pets once you start using the CHG.   CHG Shower Instructions:  If you choose to wash your hair and private area, wash first with your normal shampoo/soap.  After you use shampoo/soap, rinse your hair and body thoroughly to remove shampoo/soap residue.  Turn the water OFF and apply about 3 tablespoons (45 ml) of CHG soap to a CLEAN washcloth.  Apply CHG soap ONLY FROM YOUR NECK DOWN TO YOUR TOES (washing for 3-5 minutes)  DO NOT use CHG soap on face, private areas, open wounds, or sores.  Pay special attention to the area where your surgery is being performed.  If you are having back surgery, having someone wash your back for you may be helpful. Wait 2 minutes  after CHG soap is applied, then you may rinse off the CHG soap.  Pat dry with a clean towel  Put on clean clothes/pajamas   If you choose to wear lotion, please use ONLY the CHG-compatible lotions on the back of this paper.     Additional instructions for the day of surgery: DO NOT APPLY any lotions, deodorants, cologne, or perfumes.   Put on clean/comfortable clothes.  Brush your teeth.  Ask your nurse before applying any prescription medications to the skin.   CHG Compatible Lotions   Aveeno Moisturizing lotion  Cetaphil Moisturizing Cream  Cetaphil Moisturizing Lotion  Clairol Herbal Essence Moisturizing Lotion, Dry Skin  Clairol Herbal Essence  Moisturizing Lotion, Extra Dry Skin  Clairol Herbal Essence Moisturizing Lotion, Normal Skin  Curel Age Defying Therapeutic Moisturizing Lotion with Alpha Hydroxy  Curel Extreme Care Body Lotion  Curel Soothing Hands Moisturizing Hand Lotion  Curel Therapeutic Moisturizing Cream, Fragrance-Free  Curel Therapeutic Moisturizing Lotion, Fragrance-Free  Curel Therapeutic Moisturizing Lotion, Original Formula  Eucerin Daily Replenishing Lotion  Eucerin Dry Skin Therapy Plus Alpha Hydroxy Crme  Eucerin Dry Skin Therapy Plus Alpha Hydroxy Lotion  Eucerin Original Crme  Eucerin Original Lotion  Eucerin Plus Crme Eucerin Plus Lotion  Eucerin TriLipid Replenishing Lotion  Keri Anti-Bacterial Hand Lotion  Keri Deep Conditioning Original Lotion Dry Skin Formula Softly Scented  Keri Deep Conditioning Original Lotion, Fragrance Free Sensitive Skin Formula  Keri Lotion Fast Absorbing Fragrance Free Sensitive Skin Formula  Keri Lotion Fast Absorbing Softly Scented Dry Skin Formula  Keri Original Lotion  Keri Skin Renewal Lotion Keri Silky Smooth Lotion  Keri Silky Smooth Sensitive Skin Lotion  Nivea Body Creamy Conditioning Oil  Nivea Body Extra Enriched Lotion  Nivea Body Original Lotion  Nivea Body Sheer Moisturizing Lotion Nivea Crme  Nivea Skin Firming Lotion  NutraDerm 30 Skin Lotion  NutraDerm Skin Lotion  NutraDerm Therapeutic Skin Cream  NutraDerm Therapeutic Skin Lotion  ProShield Protective Hand Cream  Provon moisturizing lotion   Incentive Spirometer  An incentive spirometer is a tool that can help keep your lungs clear and active. This tool measures how well you are filling your lungs with each breath. Taking long deep breaths may help reverse or decrease the chance of developing breathing (pulmonary) problems (especially infection) following: A long period of time when you are unable to move or be active. BEFORE THE PROCEDURE  If the spirometer includes an indicator to show  your best effort, your nurse or respiratory therapist will set it to a desired goal. If possible, sit up straight or lean slightly forward. Try not to slouch. Hold the incentive spirometer in an upright position. INSTRUCTIONS FOR USE  Sit on the edge of your bed if possible, or sit up as far as you can in bed or on a chair. Hold the incentive spirometer in an upright position. Breathe out normally. Place the mouthpiece in your mouth and seal your lips tightly around it. Breathe in slowly and as deeply as possible, raising the piston or the ball toward the top of the column. Hold your breath for 3-5 seconds or for as long as possible. Allow the piston or ball to fall to the bottom of the column. Remove the mouthpiece from your mouth and breathe out normally. Rest for a few seconds and repeat Steps 1 through 7 at least 10 times every 1-2 hours when you are awake. Take your time and take a few normal breaths between deep breaths. The spirometer may include  an indicator to show your best effort. Use the indicator as a goal to work toward during each repetition. After each set of 10 deep breaths, practice coughing to be sure your lungs are clear. If you have an incision (the cut made at the time of surgery), support your incision when coughing by placing a pillow or rolled up towels firmly against it. Once you are able to get out of bed, walk around indoors and cough well. You may stop using the incentive spirometer when instructed by your caregiver.  RISKS AND COMPLICATIONS Take your time so you do not get dizzy or light-headed. If you are in pain, you may need to take or ask for pain medication before doing incentive spirometry. It is harder to take a deep breath if you are having pain. AFTER USE Rest and breathe slowly and easily. It can be helpful to keep track of a log of your progress. Your caregiver can provide you with a simple table to help with this. If you are using the spirometer at home,  follow these instructions: SEEK MEDICAL CARE IF:  You are having difficultly using the spirometer. You have trouble using the spirometer as often as instructed. Your pain medication is not giving enough relief while using the spirometer. You develop fever of 100.5 F (38.1 C) or higher. SEEK IMMEDIATE MEDICAL CARE IF:  You cough up bloody sputum that had not been present before. You develop fever of 102 F (38.9 C) or greater. You develop worsening pain at or near the incision site. MAKE SURE YOU:  Understand these instructions. Will watch your condition. Will get help right away if you are not doing well or get worse. Document Released: 10/24/2006 Document Revised: 09/05/2011 Document Reviewed: 12/25/2006 [facility] Patient Information 2014 [place], [place].   ________________________________________________________________________

## 2023-06-07 ENCOUNTER — Encounter (HOSPITAL_COMMUNITY)
Admission: RE | Admit: 2023-06-07 | Discharge: 2023-06-07 | Disposition: A | Discharge status: Home / Self Care | Payer: Medicare Other | Source: Ambulatory Visit | Attending: Orthopedic Surgery | Admitting: Orthopedic Surgery

## 2023-06-07 ENCOUNTER — Encounter (HOSPITAL_COMMUNITY): Payer: Self-pay

## 2023-06-07 ENCOUNTER — Other Ambulatory Visit: Payer: Self-pay

## 2023-06-07 VITALS — BP 127/101 | HR 77 | Temp 98.3°F | Ht 73.0 in | Wt 279.0 lb

## 2023-06-07 DIAGNOSIS — N189 Chronic kidney disease, unspecified: Secondary | ICD-10-CM | POA: Diagnosis not present

## 2023-06-07 DIAGNOSIS — Z01812 Encounter for preprocedural laboratory examination: Secondary | ICD-10-CM | POA: Insufficient documentation

## 2023-06-07 DIAGNOSIS — I129 Hypertensive chronic kidney disease with stage 1 through stage 4 chronic kidney disease, or unspecified chronic kidney disease: Secondary | ICD-10-CM | POA: Diagnosis not present

## 2023-06-07 DIAGNOSIS — Z01818 Encounter for other preprocedural examination: Secondary | ICD-10-CM

## 2023-06-07 DIAGNOSIS — I2510 Atherosclerotic heart disease of native coronary artery without angina pectoris: Secondary | ICD-10-CM | POA: Diagnosis not present

## 2023-06-07 DIAGNOSIS — M1711 Unilateral primary osteoarthritis, right knee: Secondary | ICD-10-CM | POA: Insufficient documentation

## 2023-06-07 HISTORY — DX: Unspecified osteoarthritis, unspecified site: M19.90

## 2023-06-07 HISTORY — DX: Personal history of urinary calculi: Z87.442

## 2023-06-07 HISTORY — DX: Atherosclerotic heart disease of native coronary artery without angina pectoris: I25.10

## 2023-06-07 HISTORY — DX: Essential (primary) hypertension: I10

## 2023-06-07 LAB — SURGICAL PCR SCREEN
MRSA, PCR: NEGATIVE
Staphylococcus aureus: POSITIVE — AB

## 2023-06-07 LAB — CBC
HCT: 46.6 % (ref 39.0–52.0)
Hemoglobin: 15.2 g/dL (ref 13.0–17.0)
MCH: 29.1 pg (ref 26.0–34.0)
MCHC: 32.6 g/dL (ref 30.0–36.0)
MCV: 89.3 fL (ref 80.0–100.0)
Platelets: 221 10*3/uL (ref 150–400)
RBC: 5.22 MIL/uL (ref 4.22–5.81)
RDW: 13.8 % (ref 11.5–15.5)
WBC: 9.4 10*3/uL (ref 4.0–10.5)
nRBC: 0 % (ref 0.0–0.2)

## 2023-06-07 LAB — BASIC METABOLIC PANEL
Anion gap: 8 (ref 5–15)
BUN: 14 mg/dL (ref 8–23)
CO2: 26 mmol/L (ref 22–32)
Calcium: 9.3 mg/dL (ref 8.9–10.3)
Chloride: 103 mmol/L (ref 98–111)
Creatinine, Ser: 1.24 mg/dL (ref 0.61–1.24)
GFR, Estimated: >60 mL/min (ref >60)
Glucose, Bld: 140 mg/dL — ABNORMAL HIGH (ref 70–99)
Potassium: 3.7 mmol/L (ref 3.5–5.1)
Sodium: 137 mmol/L (ref 135–145)

## 2023-06-07 NOTE — Progress Notes (Signed)
PCR: + STAPH °

## 2023-06-07 NOTE — Progress Notes (Signed)
For Anesthesia: PCP - Merri Brunette, MD  Cardiologist - Dr. Jacinto Halim: Theron Arista: 07/02/22 Clearance: Edd Fabian: NP: 05/31/23 Bowel Prep reminder:  Chest x-ray -  EKG - 07/12/22 Stress Test -  ECHO - 07/12/22 Cardiac Cath -  Pacemaker/ICD device last checked: Pacemaker orders received: Device Rep notified:  Spinal Cord Stimulator: N/A  Sleep Study -  N/A CPAP -   Fasting Blood Sugar -  N/A Checks Blood Sugar _____ times a day Date and result of last Hgb A1c-  Last dose of GLP1 agonist-  N/A GLP1 instructions:   Last dose of SGLT-2 inhibitors-  N/A SGLT-2 instructions:   Blood Thinner Instructions: N/A Aspirin Instructions: To hold 5 days before surgery. Last Dose:  Activity level: Can go up a flight of stairs and activities of daily living without stopping and without chest pain and/or shortness of breath   Able to exercise without chest pain and/or shortness of breath  Anesthesia review: Hx: HTN,CAD,CKD III  Patient denies shortness of breath, fever, cough and chest pain at PAT appointment   Patient verbalized understanding of instructions that were given to them at the PAT appointment. Patient was also instructed that they will need to review over the PAT instructions again at home before surgery.

## 2023-06-09 NOTE — Progress Notes (Signed)
Anesthesia Chart Review   Case: [id number] Date/Time: 06/15/23 1020   Procedure: TOTAL KNEE ARTHROPLASTY (Right: Knee)   Anesthesia type: Spinal   Pre-op diagnosis: Right knee osteoarthritis   Location: [facility] ROOM 09 / [facility]   Surgeons: Durene Romans, MD       DISCUSSION:77 y.o. never smoker with h/o HTN, CAD, CKD, right knee OA scheduled for above procedure 06/15/2023 with Dr. Durene Romans.   Per cardiology preoperative evaluation 05/31/2023, "Chart reviewed as part of pre-operative protocol coverage. Given past medical history and time since last visit, based on ACC/AHA guidelines, SILVESTER WITHROW would be at acceptable risk for the planned procedure without further cardiovascular testing.    Patient was advised that if he develops new symptoms prior to surgery to contact our office to arrange a follow-up appointment.  He verbalized understanding.   His RCRI is low risk, 0.9% risk of major cardiac event.  He is able to complete greater than 4 METS of physical activity.   Patient's aspirin is not prescribed by cardiology. Recommendations for holding aspirin will need to come from prescribing provider. "   VS: BP (!) 127/101   Pulse 77   Temp 36.8 C (Oral)   Ht 6\' 1"  (1.854 m)   Wt 126.6 kg   SpO2 97%   BMI 36.81 kg/m   PROVIDERS: Merri Brunette, MD is PCP    LABS: Labs reviewed: Acceptable for surgery. (all labs ordered are listed, but only abnormal results are displayed)  Labs Reviewed  SURGICAL PCR SCREEN - Abnormal; Notable for the following components:      Result Value   Staphylococcus aureus POSITIVE (*)    All other components within normal limits  BASIC METABOLIC PANEL - Abnormal; Notable for the following components:   Glucose, Bld 140 (*)    All other components within normal limits  CBC     IMAGES:   EKG:   CV: Echocardiogram 03/03/2022: Normal LV systolic function with visual EF 60-65%. Left ventricle cavity is normal in size. Normal left ventricular  wall thickness. Normal global wall motion. Normal diastolic filling pattern, normal LAP. No significant valvular heart disease. The aortic root is dilated at 41mm and proximal ascending aorat measures 43mm. No prior study for comparison.   Exercise nuclear stress test 02/21/22 Myocardial perfusion is normal. Low risk study. Overall LV systolic function is normal without regional wall motion abnormalities. Stress LV EF: 56%.  Normal ECG stress. The patient exercised for 5 minutes and 14 seconds of a Modified Bruce protocol, achieving approximately 7.05 METs and 95% MPHR. The heart rate response was normal. The blood pressure response was normal. No previous exam available for comparison.   Past Medical History:  Diagnosis Date   Arthritis    Chronic kidney disease    Coronary artery disease    History of kidney stones    Hyperlipidemia    Hypertension    Personal history of colonic adenomas 08/30/2012   Poison ivy     Past Surgical History:  Procedure Laterality Date   CHOLECYSTECTOMY     COLONOSCOPY  12/07/2017   TONSILLECTOMY      MEDICATIONS:  aspirin EC 81 MG tablet   atorvastatin (LIPITOR) 20 MG tablet   carvedilol (COREG) 6.25 MG tablet   olmesartan-hydrochlorothiazide (BENICAR HCT) 20-12.5 MG tablet   No current facility-administered medications for this encounter.     Jodell Cipro Ward, PA-C [facility] Pre-Surgical Testing [phone]

## 2023-06-15 ENCOUNTER — Other Ambulatory Visit: Payer: Self-pay

## 2023-06-15 ENCOUNTER — Encounter (HOSPITAL_COMMUNITY)
Admission: RE | Disposition: A | Discharge status: Home / Self Care | Payer: Self-pay | Source: Home / Self Care | Attending: Orthopedic Surgery

## 2023-06-15 ENCOUNTER — Encounter (HOSPITAL_COMMUNITY): Payer: Self-pay | Admitting: Orthopedic Surgery

## 2023-06-15 ENCOUNTER — Ambulatory Visit (HOSPITAL_COMMUNITY): Payer: Medicare Other | Admitting: Physician Assistant

## 2023-06-15 ENCOUNTER — Observation Stay (HOSPITAL_COMMUNITY)
Admission: RE | Admit: 2023-06-15 | Discharge: 2023-06-16 | Disposition: A | Discharge status: Home / Self Care | Payer: Medicare Other | Attending: Orthopedic Surgery | Admitting: Orthopedic Surgery

## 2023-06-15 ENCOUNTER — Ambulatory Visit (HOSPITAL_COMMUNITY): Payer: Medicare Other | Admitting: Anesthesiology

## 2023-06-15 DIAGNOSIS — Z7982 Long term (current) use of aspirin: Secondary | ICD-10-CM | POA: Insufficient documentation

## 2023-06-15 DIAGNOSIS — Z79899 Other long term (current) drug therapy: Secondary | ICD-10-CM | POA: Insufficient documentation

## 2023-06-15 DIAGNOSIS — M1711 Unilateral primary osteoarthritis, right knee: Secondary | ICD-10-CM | POA: Diagnosis not present

## 2023-06-15 DIAGNOSIS — I2510 Atherosclerotic heart disease of native coronary artery without angina pectoris: Secondary | ICD-10-CM | POA: Diagnosis not present

## 2023-06-15 DIAGNOSIS — Z96651 Presence of right artificial knee joint: Principal | ICD-10-CM

## 2023-06-15 DIAGNOSIS — N189 Chronic kidney disease, unspecified: Secondary | ICD-10-CM | POA: Diagnosis not present

## 2023-06-15 DIAGNOSIS — G8918 Other acute postprocedural pain: Secondary | ICD-10-CM | POA: Diagnosis not present

## 2023-06-15 DIAGNOSIS — I129 Hypertensive chronic kidney disease with stage 1 through stage 4 chronic kidney disease, or unspecified chronic kidney disease: Secondary | ICD-10-CM | POA: Diagnosis not present

## 2023-06-15 HISTORY — PX: TOTAL KNEE ARTHROPLASTY: SHX125

## 2023-06-15 SURGERY — ARTHROPLASTY, KNEE, TOTAL
Anesthesia: Spinal | Site: Knee | Laterality: Right

## 2023-06-15 MED ORDER — ONDANSETRON HCL 4 MG/2ML IJ SOLN
4.0000 mg | Freq: Four times a day (QID) | INTRAMUSCULAR | Status: DC | PRN
Start: 2023-06-15 — End: 2023-06-16

## 2023-06-15 MED ORDER — CHLORHEXIDINE GLUCONATE 0.12 % MT SOLN
15.0000 mL | Freq: Once | OROMUCOSAL | Status: AC
  Administered 2023-06-15: 15 mL via OROMUCOSAL

## 2023-06-15 MED ORDER — FENTANYL CITRATE PF 50 MCG/ML IJ SOSY: 25.0000 ug | PREFILLED_SYRINGE | INTRAMUSCULAR | Status: DC | PRN

## 2023-06-15 MED ORDER — METOCLOPRAMIDE HCL 5 MG PO TABS: 5.0000 mg | ORAL_TABLET | Freq: Three times a day (TID) | ORAL | Status: DC | PRN

## 2023-06-15 MED ORDER — ALUM & MAG HYDROXIDE-SIMETH 200-200-20 MG/5ML PO SUSP: 30.0000 mL | ORAL | Status: DC | PRN

## 2023-06-15 MED ORDER — METHOCARBAMOL 500 MG PO TABS
500.0000 mg | ORAL_TABLET | Freq: Four times a day (QID) | ORAL | Status: DC | PRN
  Administered 2023-06-16: 500 mg via ORAL
  Filled 2023-06-15: qty 1

## 2023-06-15 MED ORDER — OXYCODONE HCL 5 MG PO TABS
5.0000 mg | ORAL_TABLET | Freq: Once | ORAL | Status: AC | PRN
  Administered 2023-06-15: 5 mg via ORAL

## 2023-06-15 MED ORDER — SENNA 8.6 MG PO TABS
2.0000 | ORAL_TABLET | Freq: Every day | ORAL | Status: DC
  Administered 2023-06-15: 17.2 mg via ORAL
  Filled 2023-06-15: qty 2

## 2023-06-15 MED ORDER — FENTANYL CITRATE PF 50 MCG/ML IJ SOSY
50.0000 ug | PREFILLED_SYRINGE | INTRAMUSCULAR | Status: DC
  Administered 2023-06-15: 50 ug via INTRAVENOUS
  Filled 2023-06-15: qty 2

## 2023-06-15 MED ORDER — BUPIVACAINE IN DEXTROSE 0.75-8.25 % IT SOLN
INTRATHECAL | Status: DC | PRN
  Administered 2023-06-15: 1.8 mL via INTRATHECAL

## 2023-06-15 MED ORDER — STERILE WATER FOR IRRIGATION IR SOLN
Status: DC | PRN
  Administered 2023-06-15: 1000 mL

## 2023-06-15 MED ORDER — ORAL CARE MOUTH RINSE: 15.0000 mL | Freq: Once | OROMUCOSAL | Status: AC

## 2023-06-15 MED ORDER — PHENOL 1.4 % MT LIQD: 1.0000 | OROMUCOSAL | Status: DC | PRN

## 2023-06-15 MED ORDER — ONDANSETRON HCL 4 MG/2ML IJ SOLN
INTRAMUSCULAR | Status: AC
  Filled 2023-06-15: qty 2

## 2023-06-15 MED ORDER — OXYCODONE HCL 5 MG PO TABS
5.0000 mg | ORAL_TABLET | ORAL | Status: DC | PRN
Start: 2023-06-15 — End: 2023-06-16
  Administered 2023-06-15 – 2023-06-16 (×3): 5 mg via ORAL
  Filled 2023-06-15 (×3): qty 1

## 2023-06-15 MED ORDER — SODIUM CHLORIDE (PF) 0.9 % IJ SOLN
INTRAMUSCULAR | Status: AC
  Filled 2023-06-15: qty 30

## 2023-06-15 MED ORDER — SODIUM CHLORIDE (PF) 0.9 % IJ SOLN
INTRAMUSCULAR | Status: DC | PRN
  Administered 2023-06-15: 61 mL

## 2023-06-15 MED ORDER — DEXAMETHASONE SODIUM PHOSPHATE 10 MG/ML IJ SOLN
INTRAMUSCULAR | Status: DC | PRN
  Administered 2023-06-15: 10 mg

## 2023-06-15 MED ORDER — BISACODYL 10 MG RE SUPP: 10.0000 mg | Freq: Every day | RECTAL | Status: DC | PRN

## 2023-06-15 MED ORDER — ONDANSETRON HCL 4 MG/2ML IJ SOLN: 4.0000 mg | Freq: Once | INTRAMUSCULAR | Status: DC | PRN

## 2023-06-15 MED ORDER — SODIUM CHLORIDE 0.9% FLUSH
3.0000 mL | Freq: Two times a day (BID) | INTRAVENOUS | Status: DC
  Administered 2023-06-16: 10 mL via INTRAVENOUS

## 2023-06-15 MED ORDER — LACTATED RINGERS IV SOLN: INTRAVENOUS | Status: DC

## 2023-06-15 MED ORDER — ASPIRIN 81 MG PO CHEW
81.0000 mg | CHEWABLE_TABLET | Freq: Two times a day (BID) | ORAL | Status: DC
  Administered 2023-06-15 – 2023-06-16 (×2): 81 mg via ORAL
  Filled 2023-06-15 (×2): qty 1

## 2023-06-15 MED ORDER — DIPHENHYDRAMINE HCL 12.5 MG/5ML PO ELIX: 12.5000 mg | ORAL_SOLUTION | ORAL | Status: DC | PRN

## 2023-06-15 MED ORDER — OLMESARTAN MEDOXOMIL-HCTZ 20-12.5 MG PO TABS: 1.0000 | ORAL_TABLET | ORAL | Status: DC

## 2023-06-15 MED ORDER — CARVEDILOL 6.25 MG PO TABS
6.2500 mg | ORAL_TABLET | Freq: Two times a day (BID) | ORAL | Status: DC
Start: 2023-06-15 — End: 2023-06-16
  Administered 2023-06-15 – 2023-06-16 (×2): 6.25 mg via ORAL
  Filled 2023-06-15 (×2): qty 1

## 2023-06-15 MED ORDER — TRANEXAMIC ACID-NACL 1000-0.7 MG/100ML-% IV SOLN
1000.0000 mg | Freq: Once | INTRAVENOUS | Status: AC
  Administered 2023-06-15: 1000 mg via INTRAVENOUS
  Filled 2023-06-15: qty 100

## 2023-06-15 MED ORDER — PROPOFOL 10 MG/ML IV BOLUS
INTRAVENOUS | Status: AC
  Filled 2023-06-15: qty 20

## 2023-06-15 MED ORDER — BUPIVACAINE-EPINEPHRINE 0.25% -1:200000 IJ SOLN
INTRAMUSCULAR | Status: AC
  Filled 2023-06-15: qty 1

## 2023-06-15 MED ORDER — GLYCOPYRROLATE 0.2 MG/ML IJ SOLN
INTRAMUSCULAR | Status: DC | PRN
  Administered 2023-06-15: .1 mg via INTRAVENOUS

## 2023-06-15 MED ORDER — DEXAMETHASONE SODIUM PHOSPHATE 10 MG/ML IJ SOLN
INTRAMUSCULAR | Status: AC
  Filled 2023-06-15: qty 1

## 2023-06-15 MED ORDER — HYDROMORPHONE HCL 1 MG/ML IJ SOLN
0.5000 mg | INTRAMUSCULAR | Status: DC | PRN
Start: 2023-06-15 — End: 2023-06-16

## 2023-06-15 MED ORDER — CEFAZOLIN SODIUM-DEXTROSE 2-4 GM/100ML-% IV SOLN
2.0000 g | Freq: Four times a day (QID) | INTRAVENOUS | Status: AC
  Administered 2023-06-15 (×2): 2 g via INTRAVENOUS
  Filled 2023-06-15 (×2): qty 100

## 2023-06-15 MED ORDER — ROPIVACAINE HCL 5 MG/ML IJ SOLN
INTRAMUSCULAR | Status: DC | PRN
  Administered 2023-06-15: 20 mL via PERINEURAL

## 2023-06-15 MED ORDER — ACETAMINOPHEN 10 MG/ML IV SOLN: 1000.0000 mg | Freq: Once | INTRAVENOUS | Status: DC | PRN

## 2023-06-15 MED ORDER — POVIDONE-IODINE 10 % EX SWAB: 2.0000 | Freq: Once | CUTANEOUS | Status: DC

## 2023-06-15 MED ORDER — PROPOFOL 1000 MG/100ML IV EMUL
INTRAVENOUS | Status: AC
  Filled 2023-06-15: qty 100

## 2023-06-15 MED ORDER — POLYETHYLENE GLYCOL 3350 17 G PO PACK
17.0000 g | PACK | Freq: Two times a day (BID) | ORAL | Status: DC
  Administered 2023-06-16: 17 g via ORAL
  Filled 2023-06-15 (×2): qty 1

## 2023-06-15 MED ORDER — MENTHOL 3 MG MT LOZG: 1.0000 | LOZENGE | OROMUCOSAL | Status: DC | PRN

## 2023-06-15 MED ORDER — ONDANSETRON HCL 4 MG/2ML IJ SOLN
INTRAMUSCULAR | Status: DC | PRN
  Administered 2023-06-15: 4 mg via INTRAVENOUS

## 2023-06-15 MED ORDER — SODIUM CHLORIDE 0.9% FLUSH
10.0000 mL | Freq: Two times a day (BID) | INTRAVENOUS | Status: DC
Start: 2023-06-15 — End: 2023-06-15

## 2023-06-15 MED ORDER — ATORVASTATIN CALCIUM 20 MG PO TABS
20.0000 mg | ORAL_TABLET | Freq: Every day | ORAL | Status: DC
  Administered 2023-06-16: 20 mg via ORAL
  Filled 2023-06-15: qty 1

## 2023-06-15 MED ORDER — 0.9 % SODIUM CHLORIDE (POUR BTL) OPTIME
TOPICAL | Status: DC | PRN
  Administered 2023-06-15: 1000 mL

## 2023-06-15 MED ORDER — GLYCOPYRROLATE 0.2 MG/ML IJ SOLN
INTRAMUSCULAR | Status: AC
  Filled 2023-06-15: qty 1

## 2023-06-15 MED ORDER — METHOCARBAMOL 1000 MG/10ML IJ SOLN: 500.0000 mg | Freq: Four times a day (QID) | INTRAMUSCULAR | Status: DC | PRN

## 2023-06-15 MED ORDER — PHENYLEPHRINE HCL-NACL 20-0.9 MG/250ML-% IV SOLN
INTRAVENOUS | Status: DC | PRN
  Administered 2023-06-15: 40 ug/min via INTRAVENOUS

## 2023-06-15 MED ORDER — METOCLOPRAMIDE HCL 5 MG/ML IJ SOLN: 5.0000 mg | Freq: Three times a day (TID) | INTRAMUSCULAR | Status: DC | PRN

## 2023-06-15 MED ORDER — SODIUM CHLORIDE 0.9% FLUSH: 3.0000 mL | INTRAVENOUS | Status: DC | PRN

## 2023-06-15 MED ORDER — KETOROLAC TROMETHAMINE 30 MG/ML IJ SOLN
INTRAMUSCULAR | Status: AC
  Filled 2023-06-15: qty 1

## 2023-06-15 MED ORDER — CEFAZOLIN IN SODIUM CHLORIDE 3-0.9 GM/100ML-% IV SOLN
3.0000 g | INTRAVENOUS | Status: AC
  Administered 2023-06-15: 3 g via INTRAVENOUS
  Filled 2023-06-15: qty 100

## 2023-06-15 MED ORDER — IRBESARTAN 150 MG PO TABS
150.0000 mg | ORAL_TABLET | Freq: Every day | ORAL | Status: DC
  Administered 2023-06-16: 150 mg via ORAL
  Filled 2023-06-15: qty 1

## 2023-06-15 MED ORDER — CHLORHEXIDINE GLUCONATE 4 % EX SOLN: 1.0000 | CUTANEOUS | 1 refills | Status: DC

## 2023-06-15 MED ORDER — ONDANSETRON HCL 4 MG PO TABS: 4.0000 mg | ORAL_TABLET | Freq: Four times a day (QID) | ORAL | Status: DC | PRN

## 2023-06-15 MED ORDER — SODIUM CHLORIDE 0.9 % IR SOLN
Status: DC | PRN
  Administered 2023-06-15: 1000 mL

## 2023-06-15 MED ORDER — ACETAMINOPHEN 500 MG PO TABS
1000.0000 mg | ORAL_TABLET | Freq: Four times a day (QID) | ORAL | Status: DC
  Administered 2023-06-15 – 2023-06-16 (×4): 1000 mg via ORAL
  Filled 2023-06-15 (×4): qty 2

## 2023-06-15 MED ORDER — OXYCODONE HCL 5 MG PO TABS: 10.0000 mg | ORAL_TABLET | ORAL | Status: DC | PRN

## 2023-06-15 MED ORDER — PROPOFOL 500 MG/50ML IV EMUL
INTRAVENOUS | Status: DC | PRN
  Administered 2023-06-15: 80 ug/kg/min via INTRAVENOUS
  Administered 2023-06-15: 30 mg via INTRAVENOUS
  Administered 2023-06-15: 75 mg via INTRAVENOUS
  Administered 2023-06-15: 30 mg via INTRAVENOUS

## 2023-06-15 MED ORDER — TRANEXAMIC ACID-NACL 1000-0.7 MG/100ML-% IV SOLN
1000.0000 mg | INTRAVENOUS | Status: AC
  Administered 2023-06-15: 1000 mg via INTRAVENOUS
  Filled 2023-06-15: qty 100

## 2023-06-15 MED ORDER — OXYCODONE HCL 5 MG/5ML PO SOLN: 5.0000 mg | Freq: Once | ORAL | Status: AC | PRN

## 2023-06-15 MED ORDER — MUPIROCIN 2 % EX OINT: 1.0000 | TOPICAL_OINTMENT | Freq: Two times a day (BID) | CUTANEOUS | 0 refills | Status: AC

## 2023-06-15 MED ORDER — OXYCODONE HCL 5 MG PO TABS
ORAL_TABLET | ORAL | Status: AC
  Filled 2023-06-15: qty 1

## 2023-06-15 MED ORDER — MELOXICAM 15 MG PO TABS
15.0000 mg | ORAL_TABLET | Freq: Every day | ORAL | Status: DC
  Administered 2023-06-15 – 2023-06-16 (×2): 15 mg via ORAL
  Filled 2023-06-15 (×2): qty 1

## 2023-06-15 MED ORDER — DEXAMETHASONE SODIUM PHOSPHATE 10 MG/ML IJ SOLN
8.0000 mg | Freq: Once | INTRAMUSCULAR | Status: AC
  Administered 2023-06-15: 8 mg via INTRAVENOUS

## 2023-06-15 MED ORDER — HYDROCHLOROTHIAZIDE 12.5 MG PO TABS
12.5000 mg | ORAL_TABLET | Freq: Every day | ORAL | Status: DC
  Administered 2023-06-16: 12.5 mg via ORAL
  Filled 2023-06-15: qty 1

## 2023-06-15 MED ORDER — DEXAMETHASONE SODIUM PHOSPHATE 10 MG/ML IJ SOLN
10.0000 mg | Freq: Once | INTRAMUSCULAR | Status: AC
  Administered 2023-06-16: 10 mg via INTRAVENOUS
  Filled 2023-06-15: qty 1

## 2023-06-15 SURGICAL SUPPLY — 46 items
ATTUNE MED ANAT PAT 38 KNEE (Knees) IMPLANT
BAG COUNTER SPONGE SURGICOUNT (BAG) IMPLANT
BAG ZIPLOCK 12X15 (MISCELLANEOUS) IMPLANT
BASEPLATE TIB CMT FB PCKT SZ 8 (Knees) IMPLANT
BLADE SAW SGTL 13.0X1.19X90.0M (BLADE) ×1 IMPLANT
BNDG ELASTIC 6INX 5YD STR LF (GAUZE/BANDAGES/DRESSINGS) ×1 IMPLANT
BOWL SMART MIX CTS (DISPOSABLE) ×1 IMPLANT
CEMENT HV SMART SET (Cement) ×2 IMPLANT
COMP FEM CMT ATTUNE KNEE 7 RT (Joint) ×1 IMPLANT
COMPONENT FEM CMT ATTN KN 7 RT (Joint) IMPLANT
COVER SURGICAL LIGHT HANDLE (MISCELLANEOUS) ×1 IMPLANT
CUFF TRNQT CYL 34X4.125X (TOURNIQUET CUFF) ×1 IMPLANT
DERMABOND ADVANCED .7 DNX12 (GAUZE/BANDAGES/DRESSINGS) ×1 IMPLANT
DRAPE U-SHAPE 47X51 STRL (DRAPES) ×1 IMPLANT
DRESSING AQUACEL AG SP 3.5X10 (GAUZE/BANDAGES/DRESSINGS) ×1 IMPLANT
DRSG AQUACEL AG ADV 3.5X10 (GAUZE/BANDAGES/DRESSINGS) IMPLANT
DRSG AQUACEL AG SP 3.5X10 (GAUZE/BANDAGES/DRESSINGS) ×1
DURAPREP 26ML APPLICATOR (WOUND CARE) ×2 IMPLANT
ELECT REM PT RETURN 15FT ADLT (MISCELLANEOUS) ×1 IMPLANT
GLOVE BIO SURGEON STRL SZ 6 (GLOVE) ×1 IMPLANT
GLOVE BIOGEL PI IND STRL 6.5 (GLOVE) ×1 IMPLANT
GLOVE BIOGEL PI IND STRL 7.5 (GLOVE) ×1 IMPLANT
GLOVE ORTHO TXT STRL SZ7.5 (GLOVE) ×2 IMPLANT
GOWN STRL REUS W/ TWL LRG LVL3 (GOWN DISPOSABLE) ×2 IMPLANT
HOLDER FOLEY CATH W/STRAP (MISCELLANEOUS) IMPLANT
INSERT MED CMT ATTUNE 7 6 RT (Insert) IMPLANT
KIT TURNOVER KIT A (KITS) IMPLANT
MANIFOLD NEPTUNE II (INSTRUMENTS) ×1 IMPLANT
NDL SAFETY ECLIPSE 18X1.5 (NEEDLE) IMPLANT
NS IRRIG 1000ML POUR BTL (IV SOLUTION) ×1 IMPLANT
PACK TOTAL KNEE CUSTOM (KITS) ×1 IMPLANT
PIN FIX SIGMA LCS THRD HI (PIN) IMPLANT
PROTECTOR NERVE ULNAR (MISCELLANEOUS) ×1 IMPLANT
SET HNDPC FAN SPRY TIP SCT (DISPOSABLE) ×1 IMPLANT
SET PAD KNEE POSITIONER (MISCELLANEOUS) ×1 IMPLANT
SPIKE FLUID TRANSFER (MISCELLANEOUS) ×2 IMPLANT
SUT MNCRL AB 4-0 PS2 18 (SUTURE) ×1 IMPLANT
SUT STRATAFIX PDS+ 0 24IN (SUTURE) ×1 IMPLANT
SUT VIC AB 1 CT1 36 (SUTURE) ×1 IMPLANT
SUT VIC AB 2-0 CT1 TAPERPNT 27 (SUTURE) ×2 IMPLANT
SYR 3ML LL SCALE MARK (SYRINGE) ×1 IMPLANT
TOWEL GREEN STERILE FF (TOWEL DISPOSABLE) ×1 IMPLANT
TRAY FOLEY MTR SLVR 16FR STAT (SET/KITS/TRAYS/PACK) ×1 IMPLANT
TUBE SUCTION HIGH CAP CLEAR NV (SUCTIONS) ×1 IMPLANT
WATER STERILE IRR 1000ML POUR (IV SOLUTION) ×2 IMPLANT
WRAP KNEE MAXI GEL POST OP (GAUZE/BANDAGES/DRESSINGS) ×1 IMPLANT

## 2023-06-15 NOTE — Transfer of Care (Signed)
Immediate Anesthesia Transfer of Care Note  Patient: Wesley Trujillo  Procedure(s) Performed: TOTAL KNEE ARTHROPLASTY (Right: Knee)  Patient Location: PACU  Anesthesia Type:Spinal  Level of Consciousness: drowsy and patient cooperative  Airway & Oxygen Therapy: Patient Spontanous Breathing and Patient connected to face mask  Post-op Assessment: Report given to RN and Post -op Vital signs reviewed and stable  Post vital signs: Reviewed and stable  Last Vitals:  Vitals Value Taken Time  BP 112/72 06/15/23 1233  Temp    Pulse 59 06/15/23 1234  Resp 17 06/15/23 1234  SpO2 96 % 06/15/23 1234  Vitals shown include unfiled device data.  Last Pain:  Vitals:   06/15/23 0831  TempSrc: Oral  PainSc: 0-No pain      Patients Stated Pain Goal: 4 (06/15/23 0831)  Complications: No notable events documented.

## 2023-06-15 NOTE — Plan of Care (Signed)
  Problem: Education: Goal: Knowledge of General Education information will improve Description: Including pain rating scale, medication(s)/side effects and non-pharmacologic comfort measures Outcome: Progressing   Problem: Health Behavior/Discharge Planning: Goal: Ability to manage health-related needs will improve Outcome: Progressing   Problem: Clinical Measurements: Goal: Ability to maintain clinical measurements within normal limits will improve Outcome: Progressing Goal: Will remain free from infection Outcome: Progressing Goal: Diagnostic test results will improve Outcome: Progressing Goal: Respiratory complications will improve Outcome: Progressing Goal: Cardiovascular complication will be avoided Outcome: Progressing   Problem: Activity: Goal: Risk for activity intolerance will decrease Outcome: Adequate for Discharge   Problem: Nutrition: Goal: Adequate nutrition will be maintained Outcome: Completed/Met   Problem: Coping: Goal: Level of anxiety will decrease Outcome: Progressing   Problem: Elimination: Goal: Will not experience complications related to bowel motility Outcome: Progressing Goal: Will not experience complications related to urinary retention Outcome: Progressing   Problem: Pain Management: Goal: General experience of comfort will improve Outcome: Progressing   Problem: Safety: Goal: Ability to remain free from injury will improve Outcome: Progressing   Problem: Skin Integrity: Goal: Risk for impaired skin integrity will decrease Outcome: Adequate for Discharge   Problem: Education: Goal: Knowledge of the prescribed therapeutic regimen will improve Outcome: Progressing Goal: Individualized Educational Video(s) Outcome: Completed/Met   Problem: Activity: Goal: Ability to avoid complications of mobility impairment will improve Outcome: Progressing Goal: Range of joint motion will improve Outcome: Adequate for Discharge   Problem:  Clinical Measurements: Goal: Postoperative complications will be avoided or minimized Outcome: Progressing   Problem: Pain Management: Goal: Pain level will decrease with appropriate interventions Outcome: Progressing   Problem: Skin Integrity: Goal: Will show signs of wound healing Outcome: Progressing

## 2023-06-15 NOTE — Plan of Care (Signed)
  Problem: Activity: Goal: Risk for activity intolerance will decrease Outcome: Progressing   Problem: Nutrition: Goal: Adequate nutrition will be maintained Outcome: Progressing   Problem: Pain Management: Goal: General experience of comfort will improve Outcome: Progressing   Problem: Safety: Goal: Ability to remain free from injury will improve Outcome: Progressing

## 2023-06-15 NOTE — Anesthesia Procedure Notes (Signed)
Procedure Name: MAC Date/Time: 06/15/2023 10:50 AM  Performed by: Vanessa Verona, CRNAPre-anesthesia Checklist: Patient identified, Emergency Drugs available, Suction available and Patient being monitored Patient Re-evaluated:Patient Re-evaluated prior to induction Oxygen Delivery Method: Simple face mask

## 2023-06-15 NOTE — Discharge Instructions (Signed)

## 2023-06-15 NOTE — Anesthesia Procedure Notes (Signed)
Spinal  Patient location during procedure: OR Start time: 06/15/2023 10:50 AM End time: 06/15/2023 10:55 AM Reason for block: surgical anesthesia Staffing Performed: anesthesiologist  Anesthesiologist: Mariann Barter, MD Performed by: Mariann Barter, MD Authorized by: Mariann Barter, MD   Preanesthetic Checklist Completed: patient identified, IV checked, site marked, risks and benefits discussed, surgical consent, monitors and equipment checked, pre-op evaluation and timeout performed Spinal Block Patient position: sitting Prep: DuraPrep Patient monitoring: heart rate, cardiac monitor, continuous pulse ox and blood pressure Approach: midline Location: L3-4 Injection technique: single-shot Needle Needle type: Sprotte  Needle gauge: 24 G Needle length: 9 cm Assessment Sensory level: T4 Events: CSF return

## 2023-06-15 NOTE — Anesthesia Preprocedure Evaluation (Signed)
Anesthesia Evaluation  Patient identified by MRN, date of birth, ID band Patient awake    Reviewed: Allergy & Precautions, NPO status , Patient's Chart, lab work & pertinent test results, reviewed documented beta blocker date and time   History of Anesthesia Complications Negative for: history of anesthetic complications  Airway Mallampati: II  TM Distance: >3 FB     Dental no notable dental hx.    Pulmonary neg sleep apnea, neg COPD, neg PE   breath sounds clear to auscultation       Cardiovascular hypertension, + CAD  (-) Past MI, (-) Cardiac Stents, (-) CABG and (-) CHF (-) dysrhythmias (-) pacemaker Rhythm:Regular Rate:Normal     Neuro/Psych neg Seizures    GI/Hepatic ,neg GERD  ,,(+) neg Cirrhosis        Endo/Other    Class 3 obesity  Renal/GU CRFRenal disease     Musculoskeletal  (+) Arthritis , Osteoarthritis,    Abdominal   Peds  Hematology   Anesthesia Other Findings   Reproductive/Obstetrics                             Anesthesia Physical Anesthesia Plan  ASA: 2  Anesthesia Plan: Spinal   Post-op Pain Management: Regional block*   Induction: Intravenous  PONV Risk Score and Plan: 1 and Ondansetron  Airway Management Planned: Natural Airway and Nasal Cannula  Additional Equipment:   Intra-op Plan:   Post-operative Plan:   Informed Consent: I have reviewed the patients History and Physical, chart, labs and discussed the procedure including the risks, benefits and alternatives for the proposed anesthesia with the patient or authorized representative who has indicated his/her understanding and acceptance.     Dental advisory given  Plan Discussed with: CRNA  Anesthesia Plan Comments:        Anesthesia Quick Evaluation

## 2023-06-15 NOTE — Op Note (Signed)
NAME:  Wesley Trujillo                      MEDICAL RECORD NO.:  [id number]                             FACILITY:  [facility]      PHYSICIAN:  Madlyn Frankel. Charlann Boxer, M.D.  DATE OF BIRTH:  1946/06/08      DATE OF PROCEDURE:  06/15/2023                                     OPERATIVE REPORT         PREOPERATIVE DIAGNOSIS:  Right knee osteoarthritis.      POSTOPERATIVE DIAGNOSIS:  Right knee osteoarthritis.      FINDINGS:  The patient was noted to have complete loss of cartilage and   bone-on-bone arthritis with associated osteophytes in the medial and patellofemoral compartments of   the knee with a significant synovitis and associated effusion.  The patient had failed months of conservative treatment including medications, injection therapy, activity modification.     PROCEDURE:  Right total knee replacement.      COMPONENTS USED:  DePuy Attune FB CR MS knee   system, a size 7 femur, 8 tibia, size 6 mm CR MS AOX insert, and 38 anatomic patellar   button.      SURGEON:  Madlyn Frankel. Charlann Boxer, M.D.      ASSISTANT:  Rosalene Billings, PA-C.      ANESTHESIA:  Regional and Spinal.      SPECIMENS:  None.      COMPLICATION:  None.      DRAINS:  None.  EBL: <200 cc      TOURNIQUET TIME:  34 min at 225 mmHg     The patient was stable to the recovery room.      INDICATION FOR PROCEDURE:  Wesley Trujillo is a 77 y.o. male patient of   mine.  The patient had been seen, evaluated, and treated for months conservatively in the   office with medication, activity modification, and injections.  The patient had   radiographic changes of bone-on-bone arthritis with endplate sclerosis and osteophytes noted.  Based on the radiographic changes and failed conservative measures, the patient   decided to proceed with definitive treatment, total knee replacement.  Risks of infection, DVT, component failure, need for revision surgery, neurovascular injury were reviewed in the office setting.  The postop course was reviewed  stressing the efforts to maximize post-operative satisfaction and function.  Consent was obtained for benefit of pain   relief.      PROCEDURE IN DETAIL:  The patient was brought to the operative theater.   Once adequate anesthesia, preoperative antibiotics, 2 gm of Ancef,1 gm of Tranexamic Acid, and 10 mg of Decadron administered, the patient was positioned supine with a right thigh tourniquet placed.  The  right lower extremity was prepped and draped in sterile fashion.  A time-   out was performed identifying the patient, planned procedure, and the appropriate extremity.      The right lower extremity was placed in the [facility] leg holder.  The leg was   exsanguinated, tourniquet elevated to 225 mmHg.  A midline incision was   made followed by median parapatellar arthrotomy.  Following initial   exposure, attention was  first directed to the patella.  Precut   measurement was noted to be 24 mm.  I resected down to 14 mm and used a   38 anatomic patellar button to restore patellar height as well as cover the cut surface.      The lug holes were drilled and a metal shim was placed to protect the   patella from retractors and saw blade during the procedure.      At this point, attention was now directed to the femur.  The femoral   canal was opened with a drill, irrigated to try to prevent fat emboli.  An   intramedullary rod was passed at 5 degrees valgus, 9 mm of bone was   resected off the distal femur.  Following this resection, the tibia was   subluxated anteriorly.  Using the extramedullary guide, 2 mm of bone was resected off   the proximal medial tibia.  We confirmed the gap would be   stable medially and laterally with a size 5 spacer block as well as confirmed that the tibial cut was perpendicular in the coronal plane, checking with an alignment rod.      Once this was done, I sized the femur to be a size 7 in the anterior-   posterior dimension, chose a standard component based on  medial and   lateral dimension.  The size 7 rotation block was then pinned in   position anterior referenced using the C-clamp to set rotation.  The   anterior, posterior, and  chamfer cuts were made without difficulty nor   notching making certain that I was along the anterior cortex to help   with flexion gap stability.      The final box cut was made off the lateral aspect of distal femur.      At this point, the tibia was sized to be a size 8.  The size 8 tray was   then pinned in position through the medial third of the tubercle,   drilled, and keel punched.  Trial reduction was now carried with a 7 femur,  8 tibia, a size 6 mm CR MS insert, and the 38 anatomic patella botton.  The knee was brought to full extension with good flexion stability with the patella   tracking through the trochlea without application of pressure.  Given   all these findings the trial components removed.  Final components were   opened and cement was mixed.  The knee was irrigated with normal saline solution and pulse lavage.  The synovial lining was   then injected with 30 cc of 0.25% Marcaine with epinephrine, 1 cc of Toradol and 30 cc of NS for a total of 61 cc.     Final implants were then cemented onto cleaned and dried cut surfaces of bone with the knee brought to extension with a size 6 mm CR MS trial insert.      Once the cement had fully cured, excess cement was removed   throughout the knee.  I confirmed that I was satisfied with the range of   motion and stability, and the final size 6 mm CR MS AOX insert was chosen.  It was   placed into the knee.      The tourniquet had been let down at 34 minutes.  No significant   hemostasis was required.  The extensor mechanism was then reapproximated using #1 Vicryl and #1 Stratafix sutures with the knee   in flexion.  The   remaining wound was closed with 2-0 Vicryl and running 4-0 Monocryl.   The knee was cleaned, dried, dressed sterilely using Dermabond  and   Aquacel dressing.  The patient was then   brought to recovery room in stable condition, tolerating the procedure   well.   Please note that Physician Assistant, Rosalene Billings, PA-C was present for the entirety of the case, and was utilized for pre-operative positioning, peri-operative retractor management, general facilitation of the procedure and for primary wound closure at the end of the case.              Madlyn Frankel Charlann Boxer, M.D.    06/15/2023 9:16 AM

## 2023-06-15 NOTE — Interval H&P Note (Signed)
History and Physical Interval Note:  06/15/2023 9:16 AM  Wesley Trujillo  has presented today for surgery, with the diagnosis of Right knee osteoarthritis.  The various methods of treatment have been discussed with the patient and family. After consideration of risks, benefits and other options for treatment, the patient has consented to  Procedure(s): TOTAL KNEE ARTHROPLASTY (Right) as a surgical intervention.  The patient's history has been reviewed, patient examined, no change in status, stable for surgery.  I have reviewed the patient's chart and labs.  Questions were answered to the patient's satisfaction.     Shelda Pal

## 2023-06-15 NOTE — Anesthesia Procedure Notes (Signed)
Anesthesia Regional Block: Adductor canal block   Pre-Anesthetic Checklist: , timeout performed,  Correct Patient, Correct Site, Correct Laterality,  Correct Procedure, Correct Position, site marked,  Risks and benefits discussed,  Surgical consent,  Pre-op evaluation,  At surgeon's request and post-op pain management  Laterality: Right  Prep: Maximum Sterile Barrier Precautions used, chloraprep       Needles:  Injection technique: Single-shot  Needle Type: Echogenic Needle      Needle Gauge: 20     Additional Needles:   Procedures:,,,, ultrasound used (permanent image in chart),,    Narrative:  Start time: 06/15/2023 9:50 AM End time: 06/15/2023 9:55 AM Injection made incrementally with aspirations every 5 mL.  Performed by: Personally  Anesthesiologist: Mariann Barter, MD

## 2023-06-15 NOTE — H&P (Signed)
TOTAL KNEE ADMISSION H&P  Patient is being admitted for right total knee arthroplasty.  Therapy Plans: outpatient therapy at [facility] [place] Disposition: Home with wife Planned DVT Prophylaxis: aspirin 81mg  BID DME needed: none (has walker & CTU) PCP: Dr. Renne Crigler, clearance received Cardio: Dr. Jacinto Halim - clearance received TXA: IV Allergies: sulfa - childhood // bright red / rash Anesthesia Concerns: BMI: 35.9 Last HgbA1c: Not diabetic   Other: - Was the Psychologist, occupational of the OR at [facility] - oxycodone, robaxin, tylenol, celebrex - no hx of VTE or cancer  Subjective:  Chief Complaint:right knee pain.  HPI: Wesley Trujillo, 77 y.o. male, has a history of pain and functional disability in the right knee due to arthritis and has failed non-surgical conservative treatments for greater than 12 weeks to includeNSAID's and/or analgesics, corticosteriod injections, and activity modification.  Onset of symptoms was gradual, starting 3 years ago with gradually worsening course since that time. The patient noted no past surgery on the right knee(s).  Patient currently rates pain in the right knee(s) at 8 out of 10 with activity. Patient has worsening of pain with activity and weight bearing, pain that interferes with activities of daily living, and pain with passive range of motion.  Patient has evidence of joint space narrowing by imaging studies.  There is no active infection.  Patient Active Problem List   Diagnosis Date Noted   Atherosclerosis of coronary artery without angina pectoris 04/27/2022   History of colonic polyps 08/30/2012   Past Medical History:  Diagnosis Date   Arthritis    Chronic kidney disease    Coronary artery disease    History of kidney stones    Hyperlipidemia    Hypertension    Personal history of colonic adenomas 08/30/2012   Poison ivy     Past Surgical History:  Procedure Laterality Date   CHOLECYSTECTOMY     COLONOSCOPY  12/07/2017   TONSILLECTOMY       No current facility-administered medications for this encounter.   Current Outpatient Medications  Medication Sig Dispense Refill Last Dose/Taking   aspirin EC 81 MG tablet Take 81 mg by mouth daily.    Taking   atorvastatin (LIPITOR) 20 MG tablet Take 20 mg by mouth daily.   Taking   carvedilol (COREG) 6.25 MG tablet TAKE 1 TABLET(6.25 MG) BY MOUTH TWICE DAILY 180 tablet 0 Taking   olmesartan-hydrochlorothiazide (BENICAR HCT) 20-12.5 MG tablet TAKE 1 TABLET BY MOUTH EVERY MORNING 90 tablet 1 Taking   Allergies  Allergen Reactions   Sulfa Antibiotics     Turned red    Social History   Tobacco Use   Smoking status: Never   Smokeless tobacco: Never  Substance Use Topics   Alcohol use: Yes    Comment: socially    Family History  Problem Relation Age of Onset   Colon cancer Neg Hx    Esophageal cancer Neg Hx    Rectal cancer Neg Hx    Stomach cancer Neg Hx    Colon polyps Neg Hx      Review of Systems  Constitutional:  Negative for chills and fever.  Respiratory:  Negative for cough and shortness of breath.   Cardiovascular:  Negative for chest pain.  Gastrointestinal:  Negative for nausea and vomiting.  Musculoskeletal:  Positive for arthralgias.     Objective:  Physical Exam Well nourished and well developed. General: Alert and oriented x3, cooperative and pleasant, no acute distress. Head: normocephalic, atraumatic, neck supple. Eyes: EOMI.  Musculoskeletal: Right knee exam: Slight flexion contracture as compared to his left Flexion of 110 degrees with tightness over the anterior medial aspect knee with mild crepitation  Left knee exam: Full knee extension and flexion over 110 degrees Bilateral medial based tenderness No palpable effusions, warmth erythema Neurovascular intact distally without lower extremity edema, erythema or calf tenderness  Calves soft and nontender. Motor function intact in LE. Strength 5/5 LE bilaterally. Neuro: Distal pulses  2+. Sensation to light touch intact in LE.  Vital signs in last 24 hours:    Labs:   Estimated body mass index is 36.81 kg/m as calculated from the following:   Height as of 06/07/23: 6\' 1"  (1.854 m).   Weight as of 06/07/23: 126.6 kg.   Imaging Review Plain radiographs demonstrate severe degenerative joint disease of the right knee(s). The overall alignment isneutral. The bone quality appears to be adequate for age and reported activity level.      Assessment/Plan:  End stage arthritis, right knee   The patient history, physical examination, clinical judgment of the provider and imaging studies are consistent with end stage degenerative joint disease of the right knee(s) and total knee arthroplasty is deemed medically necessary. The treatment options including medical management, injection therapy arthroscopy and arthroplasty were discussed at length. The risks and benefits of total knee arthroplasty were presented and reviewed. The risks due to aseptic loosening, infection, stiffness, patella tracking problems, thromboembolic complications and other imponderables were discussed. The patient acknowledged the explanation, agreed to proceed with the plan and consent was signed. Patient is being admitted for inpatient treatment for surgery, pain control, PT, OT, prophylactic antibiotics, VTE prophylaxis, progressive ambulation and ADL's and discharge planning. The patient is planning to be discharged  home.     Patient's anticipated LOS is less than 2 midnights, meeting these requirements: - Younger than 65 - Lives within 1 hour of care - Has a competent adult at home to recover with post-op recover - NO history of  - Chronic pain requiring opiods  - Diabetes  - Coronary Artery Disease  - Heart failure  - Heart attack  - Stroke  - DVT/VTE  - Cardiac arrhythmia  - Respiratory Failure/COPD  - Renal failure  - Anemia  - Advanced Liver disease  Rosalene Billings,  PA-C Orthopedic Surgery [facility] [place] [phone]

## 2023-06-16 ENCOUNTER — Encounter (HOSPITAL_COMMUNITY): Payer: Self-pay | Admitting: Orthopedic Surgery

## 2023-06-16 DIAGNOSIS — Z7982 Long term (current) use of aspirin: Secondary | ICD-10-CM | POA: Diagnosis not present

## 2023-06-16 DIAGNOSIS — M1711 Unilateral primary osteoarthritis, right knee: Secondary | ICD-10-CM | POA: Diagnosis not present

## 2023-06-16 DIAGNOSIS — Z79899 Other long term (current) drug therapy: Secondary | ICD-10-CM | POA: Diagnosis not present

## 2023-06-16 DIAGNOSIS — I2510 Atherosclerotic heart disease of native coronary artery without angina pectoris: Secondary | ICD-10-CM | POA: Diagnosis not present

## 2023-06-16 DIAGNOSIS — I129 Hypertensive chronic kidney disease with stage 1 through stage 4 chronic kidney disease, or unspecified chronic kidney disease: Secondary | ICD-10-CM | POA: Diagnosis not present

## 2023-06-16 DIAGNOSIS — N189 Chronic kidney disease, unspecified: Secondary | ICD-10-CM | POA: Diagnosis not present

## 2023-06-16 LAB — CBC
HCT: 39.7 % (ref 39.0–52.0)
Hemoglobin: 13.3 g/dL (ref 13.0–17.0)
MCH: 29.1 pg (ref 26.0–34.0)
MCHC: 33.5 g/dL (ref 30.0–36.0)
MCV: 86.9 fL (ref 80.0–100.0)
Platelets: 230 10*3/uL (ref 150–400)
RBC: 4.57 MIL/uL (ref 4.22–5.81)
RDW: 13.4 % (ref 11.5–15.5)
WBC: 15 10*3/uL — ABNORMAL HIGH (ref 4.0–10.5)
nRBC: 0 % (ref 0.0–0.2)

## 2023-06-16 LAB — BASIC METABOLIC PANEL
Anion gap: 10 (ref 5–15)
BUN: 21 mg/dL (ref 8–23)
CO2: 25 mmol/L (ref 22–32)
Calcium: 9.2 mg/dL (ref 8.9–10.3)
Chloride: 101 mmol/L (ref 98–111)
Creatinine, Ser: 0.94 mg/dL (ref 0.61–1.24)
GFR, Estimated: >60 mL/min (ref >60)
Glucose, Bld: 178 mg/dL — ABNORMAL HIGH (ref 70–99)
Potassium: 3.8 mmol/L (ref 3.5–5.1)
Sodium: 136 mmol/L (ref 135–145)

## 2023-06-16 MED ORDER — OXYCODONE HCL 5 MG PO TABS: 5.0000 mg | ORAL_TABLET | ORAL | 0 refills | Status: AC | PRN

## 2023-06-16 MED ORDER — SENNA 8.6 MG PO TABS: 2.0000 | ORAL_TABLET | Freq: Every day | ORAL | 1 refills | Status: AC

## 2023-06-16 MED ORDER — POLYETHYLENE GLYCOL 3350 17 G PO PACK: 17.0000 g | PACK | Freq: Two times a day (BID) | ORAL | 0 refills | Status: DC

## 2023-06-16 MED ORDER — ASPIRIN 81 MG PO CHEW: 81.0000 mg | CHEWABLE_TABLET | Freq: Two times a day (BID) | ORAL | 0 refills | Status: AC

## 2023-06-16 MED ORDER — METHOCARBAMOL 500 MG PO TABS: 500.0000 mg | ORAL_TABLET | Freq: Four times a day (QID) | ORAL | 2 refills | Status: AC | PRN

## 2023-06-16 MED ORDER — ACETAMINOPHEN 500 MG PO TABS: 1000.0000 mg | ORAL_TABLET | Freq: Four times a day (QID) | ORAL | 0 refills | Status: AC

## 2023-06-16 MED ORDER — MELOXICAM 15 MG PO TABS: 15.0000 mg | ORAL_TABLET | Freq: Every day | ORAL | 2 refills | Status: AC

## 2023-06-16 NOTE — Anesthesia Postprocedure Evaluation (Signed)
Anesthesia Post Note  Patient: Wesley Trujillo  Procedure(s) Performed: TOTAL KNEE ARTHROPLASTY (Right: Knee)     Patient location during evaluation: PACU Anesthesia Type: Spinal Level of consciousness: oriented and awake and alert Pain management: pain level controlled Vital Signs Assessment: post-procedure vital signs reviewed and stable Respiratory status: spontaneous breathing, respiratory function stable and patient connected to nasal cannula oxygen Cardiovascular status: blood pressure returned to baseline and stable Postop Assessment: no headache, no backache and no apparent nausea or vomiting Anesthetic complications: no   No notable events documented.  Last Vitals:  Vitals:   06/16/23 1004 06/16/23 1244  BP: 123/82 (!) 136/90  Pulse: 67 62  Resp: 18 17  Temp: 36.5 C 36.6 C  SpO2: 97% 98%    Last Pain:  Vitals:   06/16/23 1255  TempSrc:   PainSc: 2                  Mariann Barter

## 2023-06-16 NOTE — Plan of Care (Signed)
  Problem: Education: Goal: Knowledge of General Education information will improve Description: Including pain rating scale, medication(s)/side effects and non-pharmacologic comfort measures Outcome: Adequate for Discharge   Problem: Health Behavior/Discharge Planning: Goal: Ability to manage health-related needs will improve Outcome: Adequate for Discharge   Problem: Clinical Measurements: Goal: Ability to maintain clinical measurements within normal limits will improve Outcome: Adequate for Discharge Goal: Will remain free from infection Outcome: Adequate for Discharge Goal: Diagnostic test results will improve Outcome: Adequate for Discharge Goal: Respiratory complications will improve Outcome: Adequate for Discharge Goal: Cardiovascular complication will be avoided Outcome: Adequate for Discharge   Problem: Activity: Goal: Risk for activity intolerance will decrease Outcome: Adequate for Discharge   Problem: Coping: Goal: Level of anxiety will decrease Outcome: Adequate for Discharge   Problem: Elimination: Goal: Will not experience complications related to bowel motility Outcome: Adequate for Discharge Goal: Will not experience complications related to urinary retention Outcome: Adequate for Discharge   Problem: Pain Management: Goal: General experience of comfort will improve Outcome: Adequate for Discharge   Problem: Safety: Goal: Ability to remain free from injury will improve Outcome: Adequate for Discharge   Problem: Skin Integrity: Goal: Risk for impaired skin integrity will decrease Outcome: Adequate for Discharge   Problem: Education: Goal: Knowledge of the prescribed therapeutic regimen will improve Outcome: Adequate for Discharge   Problem: Activity: Goal: Ability to avoid complications of mobility impairment will improve Outcome: Adequate for Discharge Goal: Range of joint motion will improve Outcome: Adequate for Discharge   Problem: Clinical  Measurements: Goal: Postoperative complications will be avoided or minimized Outcome: Adequate for Discharge   Problem: Pain Management: Goal: Pain level will decrease with appropriate interventions Outcome: Adequate for Discharge   Problem: Skin Integrity: Goal: Will show signs of wound healing Outcome: Adequate for Discharge

## 2023-06-16 NOTE — Progress Notes (Signed)
Patient ID: Wesley Trujillo, male   DOB: 01/06/1946, 77 y.o.   MRN: [id number] Subjective: 1 Day Post-Op Procedure(s) (LRB): TOTAL KNEE ARTHROPLASTY (Right)    Patient reports pain as mild at this point No events Reviewed the procedure and plans  Objective:   VITALS:   Vitals:   06/16/23 0143 06/16/23 0603  BP: 121/83 128/83  Pulse: 84 77  Resp: 17 16  Temp: 98.4 F (36.9 C) 97.7 F (36.5 C)  SpO2: 96% 96%    Neurovascular intact Incision: dressing C/D/I right knee  LABS Recent Labs    06/16/23 0333  HGB 13.3  HCT 39.7  WBC 15.0*  PLT 230    Recent Labs    06/16/23 0333  NA 136  K 3.8  BUN 21  CREATININE 0.94  GLUCOSE 178*    No results for input(s): "LABPT", "INR" in the last 72 hours.   Assessment/Plan: 1 Day Post-Op Procedure(s) (LRB): TOTAL KNEE ARTHROPLASTY (Right)   Advance diet Up with therapy Home today after therapy He has out patient PT already set up RTC in 2 weeks - goals reviewed

## 2023-06-16 NOTE — Evaluation (Signed)
Physical Therapy Evaluation Patient Details Name: Wesley Trujillo MRN: [id number] DOB: 1945/10/02 Today's Date: 06/16/2023  History of Present Illness  Pt s/p R TKR and with hx of CLD amd CAD  Clinical Impression  Pt s/p R TKR and presents with decreased R LE strength/ROM and post op pain limiting functional mobility.  Pt should progress to dc home with family assist and reports first OP PT scheduled for 06/19/23.      If plan is discharge home, recommend the following:     Can travel by private vehicle        Equipment Recommendations None recommended by PT  Recommendations for Other Services       Functional Status Assessment Patient has had a recent decline in their functional status and demonstrates the ability to make significant improvements in function in a reasonable and predictable amount of time.     Precautions / Restrictions Precautions Precautions: Knee;Fall Restrictions Weight Bearing Restrictions Per Provider Order: No Other Position/Activity Restrictions: WBAT      Mobility  Bed Mobility Overal bed mobility: Needs Assistance Bed Mobility: Supine to Sit     Supine to sit: Contact guard     General bed mobility comments: fro R LE safety    Transfers Overall transfer level: Needs assistance Equipment used: Rolling walker (2 wheels) Transfers: Sit to/from Stand Sit to Stand: Min assist, Contact guard assist           General transfer comment: cues for LE management and use of UEs to self assist    Ambulation/Gait Ambulation/Gait assistance: Min assist, Contact guard assist Gait Distance (Feet): 84 Feet Assistive device: Rolling walker (2 wheels) Gait Pattern/deviations: Step-to pattern, Decreased step length - right, Decreased step length - left, Shuffle, Trunk flexed Gait velocity: decr     General Gait Details: cues for sequence, posture and position from [employer]            Wheelchair Mobility     Tilt Bed    Modified  Rankin (Stroke Patients Only)       Balance Overall balance assessment: Mild deficits observed, not formally tested                                           Pertinent Vitals/Pain Pain Assessment Pain Assessment: 0-10 Pain Score: 4  Pain Location: r KNEE Pain Descriptors / Indicators: Aching, Sore Pain Intervention(s): Limited activity within patient's tolerance, Monitored during session, Premedicated before session, Ice applied    Home Living Family/patient expects to be discharged to:: Private residence Living Arrangements: Spouse/significant other Available Help at Discharge: Family Type of Home: House Home Access: Stairs to enter Entrance Stairs-Rails: None Secretary/administrator of Steps: 1   Home Layout: One level Home Equipment: Agricultural consultant (2 wheels)      Prior Function Prior Level of Function : Independent/Modified Independent                     Extremity/Trunk Assessment   Upper Extremity Assessment Upper Extremity Assessment: Overall WFL for tasks assessed    Lower Extremity Assessment Lower Extremity Assessment: RLE deficits/detail RLE Deficits / Details: AAROM at knee -5 - 90 with IND SLR    Cervical / Trunk Assessment Cervical / Trunk Assessment: Normal  Communication   Communication Communication: No apparent difficulties  Cognition Arousal: Alert Behavior During Therapy: WFL for tasks assessed/performed  Overall Cognitive Status: Within Functional Limits for tasks assessed                                          General Comments      Exercises Total Joint Exercises Ankle Circles/Pumps: AROM, Both, 15 reps, Supine Quad Sets: AROM, Both, 10 reps, Supine Heel Slides: AAROM, Right, 15 reps, Supine Straight Leg Raises: AAROM, AROM, Right, 10 reps, Supine Long Arc Quad: AAROM, Right, 10 reps, Seated   Assessment/Plan    PT Assessment Patient needs continued PT services  PT Problem List  Decreased strength;Decreased range of motion;Decreased activity tolerance;Decreased balance;Decreased mobility;Decreased knowledge of use of DME;Obesity;Pain       PT Treatment Interventions DME instruction;Gait training;Stair training;Functional mobility training;Therapeutic activities;Therapeutic exercise;Patient/family education    PT Goals (Current goals can be found in the Care Plan section)  Acute Rehab PT Goals Patient Stated Goal: Regain IND PT Goal Formulation: With patient Time For Goal Achievement: 06/23/23 Potential to Achieve Goals: Good    Frequency 7X/week     Co-evaluation               AM-PAC PT "6 Clicks" Mobility  Outcome Measure Help needed turning from your back to your side while in a flat bed without using bedrails?: A Little Help needed moving from lying on your back to sitting on the side of a flat bed without using bedrails?: A Little Help needed moving to and from a bed to a chair (including a wheelchair)?: A Little Help needed standing up from a chair using your arms (e.g., wheelchair or bedside chair)?: A Little Help needed to walk in hospital room?: A Little Help needed climbing 3-5 steps with a railing? : A Little 6 Click Score: 18    End of Session Equipment Utilized During Treatment: Gait belt Activity Tolerance: Patient tolerated treatment well Patient left: in chair;with call bell/phone within reach;with chair alarm set Nurse Communication: Mobility status PT Visit Diagnosis: Difficulty in walking, not elsewhere classified (R26.2)    Time: 4098-1191 PT Time Calculation (min) (ACUTE ONLY): 41 min   Charges:   PT Evaluation $PT Eval Low Complexity: 1 Low PT Treatments $Gait Training: 8-22 mins $Therapeutic Exercise: 8-22 mins PT General Charges $$ ACUTE PT VISIT: 1 Visit         Mauro Kaufmann PT Acute Rehabilitation Services Pager [phone] Office [phone]   Emilene Roma 06/16/2023, 12:57 PM

## 2023-06-16 NOTE — Progress Notes (Signed)
Physical Therapy Treatment Patient Details Name: Wesley Trujillo MRN: [id number] DOB: 02-13-1946 Today's Date: 06/16/2023   History of Present Illness Pt s/p R TKR and with hx of CLD amd CAD    PT Comments  Pt motivated and progressing well with mobility.  Pt up to ambulate increased distance in hall, negotiated stairs, and reviewed written HEP.  Pt eager for dc home this date.    If plan is discharge home, recommend the following:     Can travel by private vehicle        Equipment Recommendations  None recommended by PT    Recommendations for Other Services       Precautions / Restrictions Precautions Precautions: Knee;Fall Restrictions Weight Bearing Restrictions Per Provider Order: No Other Position/Activity Restrictions: WBAT     Mobility  Bed Mobility Overal bed mobility: Needs Assistance Bed Mobility: Supine to Sit     Supine to sit: Contact guard     General bed mobility comments: Up in chair and returns to same    Transfers Overall transfer level: Needs assistance Equipment used: Rolling walker (2 wheels) Transfers: Sit to/from Stand Sit to Stand: Contact guard assist, Supervision           General transfer comment: cues for LE management and use of UEs to self assist    Ambulation/Gait Ambulation/Gait assistance: Contact guard assist, Supervision Gait Distance (Feet): 100 Feet Assistive device: Rolling walker (2 wheels) Gait Pattern/deviations: Step-to pattern, Decreased step length - right, Decreased step length - left, Shuffle, Trunk flexed Gait velocity: decr     General Gait Details: cues for sequence, posture and position from RW   Stairs Stairs: Yes Stairs assistance: Min assist Stair Management: No rails, Step to pattern, Forwards, With walker Number of Stairs: 2 General stair comments: single step x 2 with cues for sequence   Wheelchair Mobility     Tilt Bed    Modified Rankin (Stroke Patients Only)       Balance  Overall balance assessment: Mild deficits observed, not formally tested                                          Cognition Arousal: Alert Behavior During Therapy: WFL for tasks assessed/performed Overall Cognitive Status: Within Functional Limits for tasks assessed                                          Exercises Total Joint Exercises Ankle Circles/Pumps: AROM, Both, 15 reps, Supine Quad Sets: AROM, Both, 10 reps, Supine Heel Slides: AAROM, Right, 15 reps, Supine Straight Leg Raises: AAROM, AROM, Right, 10 reps, Supine Long Arc Quad: AAROM, Right, 10 reps, Seated    General Comments        Pertinent Vitals/Pain Pain Assessment Pain Assessment: No/denies pain Pain Score: 0-No pain Pain Location: r KNEE Pain Descriptors / Indicators: Aching, Sore Pain Intervention(s): Limited activity within patient's tolerance, Monitored during session, Premedicated before session, Ice applied    Home Living Family/patient expects to be discharged to:: Private residence Living Arrangements: Spouse/significant other Available Help at Discharge: Family Type of Home: House Home Access: Stairs to enter Entrance Stairs-Rails: None Entrance Stairs-Number of Steps: 1   Home Layout: One level Home Equipment: Agricultural consultant (2 wheels)      Prior  Function            PT Goals (current goals can now be found in the care plan section) Acute Rehab PT Goals Patient Stated Goal: Regain IND PT Goal Formulation: With patient Time For Goal Achievement: 06/23/23 Potential to Achieve Goals: Good Progress towards PT goals: Progressing toward goals    Frequency    7X/week      PT Plan      Co-evaluation              AM-PAC PT "6 Clicks" Mobility   Outcome Measure  Help needed turning from your back to your side while in a flat bed without using bedrails?: A Little Help needed moving from lying on your back to sitting on the side of a flat bed  without using bedrails?: A Little Help needed moving to and from a bed to a chair (including a wheelchair)?: A Little Help needed standing up from a chair using your arms (e.g., wheelchair or bedside chair)?: A Little Help needed to walk in hospital room?: A Little Help needed climbing 3-5 steps with a railing? : A Little 6 Click Score: 18    End of Session Equipment Utilized During Treatment: Gait belt Activity Tolerance: Patient tolerated treatment well Patient left: in chair;with call bell/phone within reach;with chair alarm set Nurse Communication: Mobility status PT Visit Diagnosis: Difficulty in walking, not elsewhere classified (R26.2)     Time: 1610-9604 PT Time Calculation (min) (ACUTE ONLY): 22 min  Charges:    $Gait Training: 8-22 mins $Therapeutic Exercise: 8-22 mins PT General Charges $$ ACUTE PT VISIT: 1 Visit                     Mauro Kaufmann PT Acute Rehabilitation Services Pager [phone] Office [phone]    Lillyann Ahart 06/16/2023, 1:01 PM

## 2023-06-19 DIAGNOSIS — M25561 Pain in right knee: Secondary | ICD-10-CM | POA: Diagnosis not present

## 2023-06-19 DIAGNOSIS — M6281 Muscle weakness (generalized): Secondary | ICD-10-CM | POA: Diagnosis not present

## 2023-06-19 DIAGNOSIS — M25661 Stiffness of right knee, not elsewhere classified: Secondary | ICD-10-CM | POA: Diagnosis not present

## 2023-06-23 DIAGNOSIS — M6281 Muscle weakness (generalized): Secondary | ICD-10-CM | POA: Diagnosis not present

## 2023-06-23 DIAGNOSIS — M25561 Pain in right knee: Secondary | ICD-10-CM | POA: Diagnosis not present

## 2023-06-23 DIAGNOSIS — M25661 Stiffness of right knee, not elsewhere classified: Secondary | ICD-10-CM | POA: Diagnosis not present

## 2023-06-27 DIAGNOSIS — M25561 Pain in right knee: Secondary | ICD-10-CM | POA: Diagnosis not present

## 2023-06-27 DIAGNOSIS — M6281 Muscle weakness (generalized): Secondary | ICD-10-CM | POA: Diagnosis not present

## 2023-06-27 DIAGNOSIS — M25661 Stiffness of right knee, not elsewhere classified: Secondary | ICD-10-CM | POA: Diagnosis not present

## 2023-06-30 DIAGNOSIS — M25561 Pain in right knee: Secondary | ICD-10-CM | POA: Diagnosis not present

## 2023-06-30 DIAGNOSIS — M25661 Stiffness of right knee, not elsewhere classified: Secondary | ICD-10-CM | POA: Diagnosis not present

## 2023-06-30 DIAGNOSIS — M6281 Muscle weakness (generalized): Secondary | ICD-10-CM | POA: Diagnosis not present

## 2023-07-03 ENCOUNTER — Telehealth: Payer: Self-pay | Admitting: *Deleted

## 2023-07-03 DIAGNOSIS — I77810 Thoracic aortic ectasia: Secondary | ICD-10-CM

## 2023-07-03 NOTE — Telephone Encounter (Signed)
 New echo order placed and will make Echo Scheduler aware of this, so appropriate scheduling can be initiated.

## 2023-07-03 NOTE — Telephone Encounter (Signed)
-----   Message from Wesley Trujillo sent at 07/03/2023  9:34 AM EST ----- Please can someone create a new order for echocardiogram.. he had knee surgery and he needs to reschedule til after Rehab in Feb. Thank you.

## 2023-07-04 ENCOUNTER — Other Ambulatory Visit: Payer: Medicare Other

## 2023-07-04 ENCOUNTER — Other Ambulatory Visit (HOSPITAL_COMMUNITY): Payer: Medicare Other

## 2023-07-04 DIAGNOSIS — M6281 Muscle weakness (generalized): Secondary | ICD-10-CM | POA: Diagnosis not present

## 2023-07-04 DIAGNOSIS — M25661 Stiffness of right knee, not elsewhere classified: Secondary | ICD-10-CM | POA: Diagnosis not present

## 2023-07-04 DIAGNOSIS — M25561 Pain in right knee: Secondary | ICD-10-CM | POA: Diagnosis not present

## 2023-07-07 DIAGNOSIS — M25561 Pain in right knee: Secondary | ICD-10-CM | POA: Diagnosis not present

## 2023-07-07 DIAGNOSIS — M6281 Muscle weakness (generalized): Secondary | ICD-10-CM | POA: Diagnosis not present

## 2023-07-07 DIAGNOSIS — M25661 Stiffness of right knee, not elsewhere classified: Secondary | ICD-10-CM | POA: Diagnosis not present

## 2023-07-11 ENCOUNTER — Other Ambulatory Visit (HOSPITAL_COMMUNITY): Payer: Self-pay

## 2023-07-11 DIAGNOSIS — M25661 Stiffness of right knee, not elsewhere classified: Secondary | ICD-10-CM | POA: Diagnosis not present

## 2023-07-11 DIAGNOSIS — M25561 Pain in right knee: Secondary | ICD-10-CM | POA: Diagnosis not present

## 2023-07-11 DIAGNOSIS — M6281 Muscle weakness (generalized): Secondary | ICD-10-CM | POA: Diagnosis not present

## 2023-07-14 ENCOUNTER — Ambulatory Visit: Payer: Self-pay | Admitting: Cardiology

## 2023-07-14 DIAGNOSIS — M6281 Muscle weakness (generalized): Secondary | ICD-10-CM | POA: Diagnosis not present

## 2023-07-14 DIAGNOSIS — M25561 Pain in right knee: Secondary | ICD-10-CM | POA: Diagnosis not present

## 2023-07-14 DIAGNOSIS — M25661 Stiffness of right knee, not elsewhere classified: Secondary | ICD-10-CM | POA: Diagnosis not present

## 2023-07-18 DIAGNOSIS — M6281 Muscle weakness (generalized): Secondary | ICD-10-CM | POA: Diagnosis not present

## 2023-07-18 DIAGNOSIS — M25661 Stiffness of right knee, not elsewhere classified: Secondary | ICD-10-CM | POA: Diagnosis not present

## 2023-07-18 DIAGNOSIS — M25561 Pain in right knee: Secondary | ICD-10-CM | POA: Diagnosis not present

## 2023-07-21 DIAGNOSIS — M25561 Pain in right knee: Secondary | ICD-10-CM | POA: Diagnosis not present

## 2023-07-21 DIAGNOSIS — M25661 Stiffness of right knee, not elsewhere classified: Secondary | ICD-10-CM | POA: Diagnosis not present

## 2023-07-21 DIAGNOSIS — M6281 Muscle weakness (generalized): Secondary | ICD-10-CM | POA: Diagnosis not present

## 2023-07-23 ENCOUNTER — Other Ambulatory Visit: Payer: Self-pay | Admitting: Cardiology

## 2023-07-23 DIAGNOSIS — I25118 Atherosclerotic heart disease of native coronary artery with other forms of angina pectoris: Secondary | ICD-10-CM

## 2023-07-23 DIAGNOSIS — I10 Essential (primary) hypertension: Secondary | ICD-10-CM

## 2023-07-25 DIAGNOSIS — M6281 Muscle weakness (generalized): Secondary | ICD-10-CM | POA: Diagnosis not present

## 2023-07-25 DIAGNOSIS — M25561 Pain in right knee: Secondary | ICD-10-CM | POA: Diagnosis not present

## 2023-07-25 DIAGNOSIS — M25661 Stiffness of right knee, not elsewhere classified: Secondary | ICD-10-CM | POA: Diagnosis not present

## 2023-07-28 DIAGNOSIS — M6281 Muscle weakness (generalized): Secondary | ICD-10-CM | POA: Diagnosis not present

## 2023-07-28 DIAGNOSIS — M25661 Stiffness of right knee, not elsewhere classified: Secondary | ICD-10-CM | POA: Diagnosis not present

## 2023-07-28 DIAGNOSIS — M25561 Pain in right knee: Secondary | ICD-10-CM | POA: Diagnosis not present

## 2023-08-02 DIAGNOSIS — Z471 Aftercare following joint replacement surgery: Secondary | ICD-10-CM | POA: Diagnosis not present

## 2023-08-02 DIAGNOSIS — Z96651 Presence of right artificial knee joint: Secondary | ICD-10-CM | POA: Diagnosis not present

## 2023-08-08 ENCOUNTER — Ambulatory Visit: Payer: Medicare Other | Admitting: Cardiology

## 2023-08-15 ENCOUNTER — Ambulatory Visit (HOSPITAL_COMMUNITY): Payer: Medicare Other | Attending: Cardiology

## 2023-08-15 ENCOUNTER — Encounter: Payer: Self-pay | Admitting: Cardiology

## 2023-08-15 DIAGNOSIS — I77810 Thoracic aortic ectasia: Secondary | ICD-10-CM | POA: Insufficient documentation

## 2023-08-15 DIAGNOSIS — I5030 Unspecified diastolic (congestive) heart failure: Secondary | ICD-10-CM

## 2023-08-15 LAB — ECHOCARDIOGRAM COMPLETE
Area-P 1/2: 2.43 cm2
S' Lateral: 2.9 cm

## 2023-08-15 NOTE — Progress Notes (Signed)
Very stable aortic root dilatation from 2 years ago both by CT scan and also by echocardiogram.  Normal heart function with mild diastolic dysfunction.  Minor valvular abnormality.  Diastolic function means the heart has to relax to receive the blood so it can pump the blood out. If relaxation is impaired, then the blood coming to the heart is restricted and can cause dyspnea and reduced functional capacity and fluid build up. Exercise, weight loss, control of BP, salt restriction will improve this.

## 2023-09-11 NOTE — Progress Notes (Deleted)
 If you have not already started a GLP-1, he may qualify for the [employer] Outcomes trial with retatrutide for subjects with CAD and CKD.  He would be randomized to retatrutide or placebo 1:1.

## 2023-09-13 ENCOUNTER — Other Ambulatory Visit: Payer: Self-pay | Admitting: Cardiology

## 2023-09-13 DIAGNOSIS — I10 Essential (primary) hypertension: Secondary | ICD-10-CM

## 2023-09-19 ENCOUNTER — Ambulatory Visit: Payer: Self-pay | Attending: Cardiology | Admitting: Cardiology

## 2023-09-20 ENCOUNTER — Encounter: Payer: Self-pay | Admitting: Cardiology

## 2023-10-02 ENCOUNTER — Other Ambulatory Visit: Payer: Self-pay | Admitting: Cardiology

## 2023-10-02 DIAGNOSIS — I10 Essential (primary) hypertension: Secondary | ICD-10-CM

## 2023-11-08 DIAGNOSIS — R7303 Prediabetes: Secondary | ICD-10-CM | POA: Diagnosis not present

## 2023-11-08 DIAGNOSIS — Z125 Encounter for screening for malignant neoplasm of prostate: Secondary | ICD-10-CM | POA: Diagnosis not present

## 2023-11-08 DIAGNOSIS — I10 Essential (primary) hypertension: Secondary | ICD-10-CM | POA: Diagnosis not present

## 2023-11-08 DIAGNOSIS — R748 Abnormal levels of other serum enzymes: Secondary | ICD-10-CM | POA: Diagnosis not present

## 2023-11-08 LAB — LAB REPORT - SCANNED
A1c: 6.4
EGFR: 63
PSA, Total: 0.09

## 2023-11-15 DIAGNOSIS — Z0000 Encounter for general adult medical examination without abnormal findings: Secondary | ICD-10-CM | POA: Diagnosis not present

## 2023-11-17 DIAGNOSIS — H35363 Drusen (degenerative) of macula, bilateral: Secondary | ICD-10-CM | POA: Diagnosis not present

## 2024-01-10 ENCOUNTER — Other Ambulatory Visit: Payer: Self-pay | Admitting: Cardiology

## 2024-01-10 DIAGNOSIS — I10 Essential (primary) hypertension: Secondary | ICD-10-CM

## 2024-01-25 ENCOUNTER — Other Ambulatory Visit (HOSPITAL_COMMUNITY): Payer: Self-pay

## 2024-01-25 ENCOUNTER — Encounter: Payer: Self-pay | Admitting: Cardiology

## 2024-01-25 ENCOUNTER — Ambulatory Visit: Attending: Cardiology | Admitting: Cardiology

## 2024-01-25 VITALS — BP 134/87 | HR 72 | Resp 14 | Ht 73.0 in | Wt 289.6 lb

## 2024-01-25 DIAGNOSIS — I25118 Atherosclerotic heart disease of native coronary artery with other forms of angina pectoris: Secondary | ICD-10-CM

## 2024-01-25 DIAGNOSIS — I10 Essential (primary) hypertension: Secondary | ICD-10-CM | POA: Diagnosis not present

## 2024-01-25 DIAGNOSIS — E7800 Pure hypercholesterolemia, unspecified: Secondary | ICD-10-CM

## 2024-01-25 DIAGNOSIS — I77810 Thoracic aortic ectasia: Secondary | ICD-10-CM | POA: Diagnosis not present

## 2024-01-25 DIAGNOSIS — E119 Type 2 diabetes mellitus without complications: Secondary | ICD-10-CM | POA: Diagnosis not present

## 2024-01-25 MED ORDER — OLMESARTAN MEDOXOMIL-HCTZ 40-12.5 MG PO TABS: 1.0000 | ORAL_TABLET | ORAL | 3 refills | Status: AC

## 2024-01-25 MED ORDER — OLMESARTAN MEDOXOMIL-HCTZ 40-12.5 MG PO TABS
1.0000 | ORAL_TABLET | ORAL | 3 refills | Status: DC
  Filled 2024-01-25: qty 30, 30d supply, fill #0

## 2024-01-25 MED ORDER — OLMESARTAN MEDOXOMIL-HCTZ 20-12.5 MG PO TABS
1.0000 | ORAL_TABLET | ORAL | 3 refills | Status: DC
  Filled 2024-01-25: qty 90, 90d supply, fill #0

## 2024-01-25 NOTE — Progress Notes (Unsigned)
 Cardiology Office Note:  .   Date:  01/25/2024  ID:  Wesley Trujillo, DOB [birth year], MRN [id number] PCP: Clarice Nottingham, MD  [facility] Providers Cardiologist:  None { Click to update primary MD,subspecialty MD or APP then REFRESH:1}  History of Present Illness: Wesley   JOSEF Trujillo is a 78 y.o.  Patient with primary hypertension, stage IIIa chronic kidney disease, moderate obesity, hyperglycemia, CAD with markedly elevated coronary calcium  score >1000 in the 87th percentile in May 2023, mild coronary artery atherosclerosis by duplex in 2016, ascending aortic dilatation   Myocardial perfusion scan on 02/21/2022: Normal perfusion.  EF 56%.  Exercised for 5 minutes and 14 seconds and achieved 7.05 METS.  Echocardiogram 08/15/2023: Normal LVEF 60 to 65%, grade 1 diastolic dysfunction.  Mild aortic root dilatation measuring 42 mm unchanged from 03/03/2022.  Discussed the use of AI scribe software for clinical note transcription with the patient, who gave verbal consent to proceed.  History of Present Illness    Labs   External Labs:  Care Everywhere PCP labs 11/09/2023:  Hb 14.4/HCT 44.3, platelets 212, normal indicis.  Serum glucose 115 mg, BUN 17, creatinine 1.20, EGFR 63 mL, potassium 3.8.  A1c 6.4%.  Total cholesterol 06/28/2011, triglycerides 106, HDL 44, LDL 49.  ROS  ROS Physical Exam:   VS:  BP 134/87 (BP Location: Left Arm, Patient Position: Sitting, Cuff Size: Large)   Pulse 72   Resp 14   Ht 6' 1 (1.854 m)   Wt 289 lb 9.6 oz (131.4 kg)   SpO2 96%   BMI 38.21 kg/m    Wt Readings from Last 3 Encounters:  01/25/24 289 lb 9.6 oz (131.4 kg)  06/15/23 279 lb (126.6 kg)  06/07/23 279 lb (126.6 kg)    Physical Exam Studies Reviewed: Wesley    ECHOCARDIOGRAM COMPLETE 08/15/2023  1. Left ventricular ejection fraction, by estimation, is 60 to 65%. The left ventricle has normal function. The left ventricle has no regional wall motion abnormalities. There is mild left  ventricular hypertrophy of the basal-septal segment. Left ventricular diastolic parameters are consistent with Grade I diastolic dysfunction (impaired relaxation). 2. Right ventricular systolic function is normal. The right ventricular size is normal. Tricuspid regurgitation signal is inadequate for assessing PA pressure. 3. The mitral valve is normal in structure. Trivial mitral valve regurgitation. No evidence of mitral stenosis. 4. The aortic valve is tricuspid. Aortic valve regurgitation is not visualized. No aortic stenosis is present. 5. Aortic dilatation noted. There is mild dilatation of the aortic root, measuring 42 mm. There is mild dilatation of the ascending aorta, measuring 41 mm. 6. The inferior vena cava is normal in size with greater than 50% respiratory variability, suggesting right atrial pressure of 3 mmHg. No change from 03/03/2022  EKG:    EKG Interpretation Date/Time:  Thursday January 25 2024 15:42:34 EDT Ventricular Rate:  73 PR Interval:  170 QRS Duration:  88 QT Interval:  382 QTC Calculation: 420 R Axis:   -5  Text Interpretation: EKG 01/25/2024: Normal sinus rhythm at rate of 73 bpm, poor R wave progression, probably normal variant.  No evidence of ischemia. Confirmed by Lateisha Thurlow, Jagadeesh ([id number]) on 01/25/2024 3:56:31 PM    Medications ordered    Meds ordered this encounter  Medications   olmesartan -hydrochlorothiazide  (BENICAR  HCT) 20-12.5 MG tablet    Sig: Take 1 tablet by mouth every morning.    Dispense:  90 tablet    Refill:  3    Pt must  keep upcoming appt in July 2025 with Dr. Ladona before anymore refills. Thank you Final attempt     ASSESSMENT AND PLAN: .      ICD-10-CM   1. Coronary artery disease involving native coronary artery of native heart with other form of angina pectoris (HCC)  I25.118     2. Aortic root dilatation (HCC)  I77.810 EKG 12-Lead    3. Type 2 diabetes mellitus without complication, without long-term current use of insulin (HCC)   E11.9     4. Primary hypertension  I10 olmesartan -hydrochlorothiazide  (BENICAR  HCT) 20-12.5 MG tablet    5. Pure hypercholesterolemia  E78.00       Assessment and Plan Assessment & Plan      Signed,  Gordy Ladona, MD, [facility] 01/25/2024, 4:09 PM [facility] [address] [place], [place] [address] Phone: [phone]. Fax:  [phone]

## 2024-01-25 NOTE — Patient Instructions (Signed)
 Medication Instructions:  Your physician has recommended you make the following change in your medication:  INCREASE OLMESARTAN  TO 40-12.5 MG DAILY.  *If you need a refill on your cardiac medications before your next appointment, please call your pharmacy*  Lab Work: NONE If you have labs (blood work) drawn today and your tests are completely normal, you will receive your results only by: MyChart Message (if you have MyChart) OR A paper copy in the mail If you have any lab test that is abnormal or we need to change your treatment, we will call you to review the results.  Testing/Procedures: Your physician has requested that you have an echocardiogram. Echocardiography is a painless test that uses sound waves to create images of your heart. It provides your doctor with information about the size and shape of your heart and how well your heart's chambers and valves are working. This procedure takes approximately one hour. There are no restrictions for this procedure. TO BE DONE IN 2 YEARS  Please do NOT wear cologne, perfume, aftershave, or lotions (deodorant is allowed). Please arrive 15 minutes prior to your appointment time.  Please note: We ask at that you not bring children with you during ultrasound (echo/ vascular) testing. Due to room size and safety concerns, children are not allowed in the ultrasound rooms during exams. Our front office staff cannot provide observation of children in our lobby area while testing is being conducted. An adult accompanying a patient to their appointment will only be allowed in the ultrasound room at the discretion of the ultrasound technician under special circumstances. We apologize for any inconvenience.   Follow-Up: At [facility], you and your health needs are our priority.  As part of our continuing mission to provide you with exceptional heart care, our providers are all part of one team.  This team includes your primary Cardiologist  (physician) and Advanced Practice Providers or APPs (Physician Assistants and Nurse Practitioners) who all work together to provide you with the care you need, when you need it.  Your next appointment:   2 year(s)  Provider:   DR. LADONA  We recommend signing up for the patient portal called MyChart.  Sign up information is provided on this After Visit Summary.  MyChart is used to connect with patients for Virtual Visits (Telemedicine).  Patients are able to view lab/test results, encounter notes, upcoming appointments, etc.  Non-urgent messages can be sent to your provider as well.   To learn more about what you can do with MyChart, go to [email or web address].   Other Instructions YOUR PROVIDER RECOMMENDS THAT YOU BE REFERRED TO SEE THE PHARM D CLINIC

## 2024-02-15 DIAGNOSIS — R7303 Prediabetes: Secondary | ICD-10-CM | POA: Diagnosis not present

## 2024-03-07 NOTE — Progress Notes (Deleted)
 Patient ID: Wesley Trujillo                 DOB: Apr 08, 1946                    MRN: [id number]     HPI: Wesley Trujillo is a 78 y.o. male patient referred to pharmacy clinic by [facility] to initiate GLP1-RA therapy. PMH is significant for CADwith markedly elevated coronary calcium  score >1000 in the 87th percentile in May 2023 , prediabetes, hypertension,stage IIIa chronic kidney disease  and obesity. Most recent BMI 38.22 kg/m .  Baseline weight and BMI: 279 lbs 36.82 kg/m  Current weight and BMI: 289 lbs 38.22 kg/m  Current meds that affect weight: ****  *** If diabetic and on insulin/sulfonylurea, can consider reducing dose to reduce risk of hypoglycemia  *** Follow-up visit  Assess % weight loss Assess adverse effects Missed doses  Diet:   Exercise:   Family History:   Social History:   Labs: No results found for: HGBA1C  Wt Readings from Last 1 Encounters:  01/25/24 289 lb 9.6 oz (131.4 kg)    BP Readings from Last 1 Encounters:  01/25/24 134/87   Pulse Readings from Last 1 Encounters:  01/25/24 72    No results found for: CHOL, TRIG, HDL, CHOLHDL, VLDL, LDLCALC, LDLDIRECT  Past Medical History:  Diagnosis Date   Arthritis    Chronic kidney disease    Coronary artery disease    History of kidney stones    Hyperlipidemia    Hypertension    Personal history of colonic adenomas 08/30/2012   Poison ivy     Current Outpatient Medications on File Prior to Visit  Medication Sig Dispense Refill   acetaminophen  (TYLENOL ) 500 MG tablet Take 2 tablets (1,000 mg total) by mouth every 6 (six) hours. (Patient not taking: Reported on 01/25/2024) 30 tablet 0   aspirin  81 MG chewable tablet Chew 1 tablet (81 mg total) by mouth 2 (two) times daily. 60 tablet 0   atorvastatin  (LIPITOR) 20 MG tablet Take 20 mg by mouth daily.     carvedilol  (COREG ) 6.25 MG tablet TAKE 1 TABLET(6.25 MG) BY MOUTH TWICE DAILY 180 tablet 0   meloxicam  (MOBIC ) 15 MG tablet Take 1  tablet (15 mg total) by mouth daily. 30 tablet 2   methocarbamol  (ROBAXIN ) 500 MG tablet Take 1 tablet (500 mg total) by mouth every 6 (six) hours as needed for muscle spasms. 60 tablet 2   olmesartan -hydrochlorothiazide  (BENICAR  HCT) 40-12.5 MG tablet Take 1 tablet by mouth every morning. 90 tablet 3   oxyCODONE  (OXY IR/ROXICODONE ) 5 MG immediate release tablet Take 1-2 tablets (5-10 mg total) by mouth every 4 (four) hours as needed for moderate pain (pain score 4-6) (pain score 4-6). 30 tablet 0   senna (SENOKOT) 8.6 MG TABS tablet Take 2 tablets (17.2 mg total) by mouth at bedtime. 60 tablet 1   No current facility-administered medications on file prior to visit.    Allergies  Allergen Reactions   Sulfa Antibiotics     Turned red     Assessment/Plan:  1. Weight loss -   No problems updated. No problem-specific Assessment & Plan notes found for this encounter.    Wesley Trujillo, Pharm.D  Wesley Trujillo. [facility] [address], [place], [place] [address] Phone: [phone]; Fax: [phone]

## 2024-03-08 ENCOUNTER — Other Ambulatory Visit (HOSPITAL_COMMUNITY): Payer: Self-pay

## 2024-03-08 ENCOUNTER — Ambulatory Visit: Admitting: Pharmacist

## 2024-03-08 ENCOUNTER — Telehealth: Payer: Self-pay | Admitting: Pharmacist

## 2024-03-08 DIAGNOSIS — Z23 Encounter for immunization: Secondary | ICD-10-CM | POA: Diagnosis not present

## 2024-03-08 NOTE — Telephone Encounter (Signed)
 Ozempic/Mounjaro  is approved exclusively as an adjunct to diet and exercise to improve glycemic control in adults with type 2 diabetes mellitus. A review of patient's medical chart reveals no documented diagnosis of type 2 diabetes or an A1C indicative of diabetes. Therefore, they do not currently meet the criteria for prior authorization of this medication. If clinically appropriate, alternative options such as Saxenda, Zepbound , or Frederik Jansky may be considered for this patient. Please advise

## 2024-03-13 DIAGNOSIS — K080 Exfoliation of teeth due to systemic causes: Secondary | ICD-10-CM | POA: Diagnosis not present

## 2024-04-12 NOTE — Telephone Encounter (Signed)
 That would be awesome. I have talked to him about this and agree

## 2024-04-16 ENCOUNTER — Telehealth: Payer: Self-pay | Admitting: Pharmacist

## 2024-04-16 MED ORDER — CARVEDILOL 6.25 MG PO TABS: 6.2500 mg | ORAL_TABLET | Freq: Two times a day (BID) | ORAL | 3 refills | Status: AC

## 2024-04-16 NOTE — Telephone Encounter (Signed)
 Pt called to request refill on carvedilol . Prescription sent to preferred pharmacy

## 2024-05-17 DIAGNOSIS — R7303 Prediabetes: Secondary | ICD-10-CM | POA: Diagnosis not present

## 2024-05-20 DIAGNOSIS — H35363 Drusen (degenerative) of macula, bilateral: Secondary | ICD-10-CM | POA: Diagnosis not present
